# Patient Record
Sex: Male | Born: 1995 | Race: White | Hispanic: No | Marital: Single | Smoking: Never smoker
Health system: Southern US, Community
[De-identification: ages and names within clinical notes are randomized; demographics above are authoritative.]

## PROBLEM LIST (undated history)

## (undated) DIAGNOSIS — R7989 Other specified abnormal findings of blood chemistry: Secondary | ICD-10-CM

## (undated) DIAGNOSIS — R6882 Decreased libido: Secondary | ICD-10-CM

## (undated) DIAGNOSIS — F419 Anxiety disorder, unspecified: Secondary | ICD-10-CM

## (undated) HISTORY — PX: NO PAST SURGERIES: SHX2092

## (undated) HISTORY — DX: Other specified abnormal findings of blood chemistry: R79.89

## (undated) HISTORY — DX: Anxiety disorder, unspecified: F41.9

## (undated) HISTORY — DX: Decreased libido: R68.82

---

## 2019-03-31 ENCOUNTER — Encounter: Payer: Self-pay | Admitting: Sports Medicine

## 2019-03-31 ENCOUNTER — Ambulatory Visit (INDEPENDENT_AMBULATORY_CARE_PROVIDER_SITE_OTHER): Payer: 59 | Admitting: Sports Medicine

## 2019-03-31 DIAGNOSIS — L723 Sebaceous cyst: Secondary | ICD-10-CM | POA: Diagnosis not present

## 2019-03-31 NOTE — Progress Notes (Signed)
Subjective:    CC: Facial mass  HPI:  This is a pleasant 23 year old male, for the past several months has had a slowly growing mass on his left cheek, just in front of the tragus.  Minimally tender, occasionally drains.  Symptoms are moderate, persistent, localized without radiation.  He is eager to have this removed.  I reviewed the past medical history, family history, social history, surgical history, and allergies today and no changes were needed.  Please see the problem list section below in epic for further details.  Past Medical History: Past Medical History:  Diagnosis Date  . No pertinent past medical history    Past Surgical History: Past Surgical History:  Procedure Laterality Date  . NO PAST SURGERIES     Social History: Social History   Socioeconomic History  . Marital status: Single    Spouse name: Not on file  . Number of children: Not on file  . Years of education: Not on file  . Highest education level: Not on file  Occupational History  . Not on file  Social Needs  . Financial resource strain: Not on file  . Food insecurity:    Worry: Not on file    Inability: Not on file  . Transportation needs:    Medical: Not on file    Non-medical: Not on file  Tobacco Use  . Smoking status: Former Games developermoker  . Smokeless tobacco: Never Used  Substance and Sexual Activity  . Alcohol use: Yes  . Drug use: Never  . Sexual activity: Yes  Lifestyle  . Physical activity:    Days per week: Not on file    Minutes per session: Not on file  . Stress: Not on file  Relationships  . Social connections:    Talks on phone: Not on file    Gets together: Not on file    Attends religious service: Not on file    Active member of club or organization: Not on file    Attends meetings of clubs or organizations: Not on file    Relationship status: Not on file  Other Topics Concern  . Not on file  Social History Narrative  . Not on file   Family History: No family history  on file. Allergies: No Known Allergies Medications: See med rec.  Review of Systems: No headache, visual changes, nausea, vomiting, diarrhea, constipation, dizziness, abdominal pain, skin rash, fevers, chills, night sweats, swollen lymph nodes, weight loss, chest pain, body aches, joint swelling, muscle aches, shortness of breath, mood changes, visual or auditory hallucinations.  Objective:    General: Well Developed, well nourished, and in no acute distress.  Neuro: Alert and oriented x3, extra-ocular muscles intact, sensation grossly intact.  HEENT: Normocephalic, atraumatic, pupils equal round reactive to light, neck supple, no masses, no lymphadenopathy, thyroid nonpalpable.  Oropharynx, nasopharynx, ear canals unremarkable, there is a 2 cm subcutaneous mass, well-defined movable anterior to the left tragus, there is a small opening, mild amount of purulent drainage is expressible. Skin: Warm and dry, no rashes noted.  Cardiac: Regular rate and rhythm, no murmurs rubs or gallops.  Respiratory: Clear to auscultation bilaterally. Not using accessory muscles, speaking in full sentences.  Abdominal: Soft, nontender, nondistended, positive bowel sounds, no masses, no organomegaly.  Musculoskeletal: Shoulder, elbow, wrist, hip, knee, ankle stable, and with full range of motion.  Procedure:  Excision of 2 cm left-sided facial mass/subcu tumor. Risks, benefits, and alternatives explained and consent obtained. Time out conducted. Surface prepped with  alcohol. 5cc lidocaine with epinephine infiltrated in a field block. Adequate anesthesia ensured. Area prepped and draped in a sterile fashion. Excision performed with: Using a #10 blade I made a linear incision, this was then carried around in an ellipse to fully encircle the opening to the lesion.  Then using both sharp and blunt dissection I removed the lesion, including its capsule, some sebaceous contents were also expressed.  Once I was  satisfied with the removal, and glistening subcutaneous tissue was visible I then closed the incision with a running subcuticular 4-0 Vicryl suture. Hemostasis achieved. Dermabond applied to seal the incision. Pt stable.  Impression and Recommendations:    The patient was counselled, risk factors were discussed, anticipatory guidance given.  Sebaceous cyst left face Surgical excision of 2 cm subcutaneous mass with primary closure, return in 1 week for a wound check.   ___________________________________________ Ihor Austin. Benjamin Stain, M.D., ABFM., CAQSM. Primary Care and Sports Medicine [facility]  Adjunct Professor of Family Medicine  [employer]

## 2019-03-31 NOTE — Assessment & Plan Note (Signed)
Surgical excision of 2 cm subcutaneous mass with primary closure, return in 1 week for a wound check.

## 2019-03-31 NOTE — Patient Instructions (Signed)
Incision Care, Adult °An incision is a surgical cut that is made through your skin. Most incisions are closed after surgery. Your incision may be closed with stitches (sutures), staples, skin glue, or adhesive strips. You may need to return to your health care provider to have sutures or staples removed. This may occur several days to several weeks after your surgery. The incision needs to be cared for properly to prevent infection. °How to care for your incision °Incision care ° °· Follow instructions from your health care provider about how to take care of your incision. Make sure you: °? Wash your hands with soap and water before you change the bandage (dressing). If soap and water are not available, use hand sanitizer. °? Change your dressing as told by your health care provider. °? Leave sutures, skin glue, or adhesive strips in place. These skin closures may need to stay in place for 2 weeks or longer. If adhesive strip edges start to loosen and curl up, you may trim the loose edges. Do not remove adhesive strips completely unless your health care provider tells you to do that. °· Check your incision area every day for signs of infection. Check for: °? More redness, swelling, or pain. °? More fluid or blood. °? Warmth. °? Pus or a bad smell. °· Ask your health care provider how to clean the incision. This may include: °? Using mild soap and water. °? Using a clean towel to pat the incision dry after cleaning it. °? Applying a cream or ointment. Do this only as told by your health care provider. °? Covering the incision with a clean dressing. °· Ask your health care provider when you can leave the incision uncovered. °· Do not take baths, swim, or use a hot tub until your health care provider approves. Ask your health care provider if you can take showers. You may only be allowed to take sponge baths for bathing. °Medicines °· If you were prescribed an antibiotic medicine, cream, or ointment, take or apply the  antibiotic as told by your health care provider. Do not stop taking or applying the antibiotic even if your condition improves. °· Take over-the-counter and prescription medicines only as told by your health care provider. °General instructions °· Limit movement around your incision to improve healing. °? Avoid straining, lifting, or exercise for the first month, or for as long as told by your health care provider. °? Follow instructions from your health care provider about returning to your normal activities. °? Ask your health care provider what activities are safe. °· Protect your incision from the sun when you are outside for the first 6 months, or for as long as told by your health care provider. Apply sunscreen around the scar or cover it up. °· Keep all follow-up visits as told by your health care provider. This is important. °Contact a health care provider if: °· Your have more redness, swelling, or pain around the incision. °· You have more fluid or blood coming from the incision. °· Your incision feels warm to the touch. °· You have pus or a bad smell coming from the incision. °· You have a fever or shaking chills. °· You are nauseous or you vomit. °· You are dizzy. °· Your sutures or staples come undone. °Get help right away if: °· You have a red streak coming from your incision. °· Your incision bleeds through the dressing and the bleeding does not stop with gentle pressure. °· The edges of   your incision open up and separate. °· You have severe pain. °· You have a rash. °· You are confused. °· You faint. °· You have trouble breathing and a fast heartbeat. °This information is not intended to replace advice given to you by your health care provider. Make sure you discuss any questions you have with your health care provider. °Document Released: 05/09/2005 Document Revised: 06/27/2016 Document Reviewed: 05/07/2016 °Elsevier Interactive Patient Education © 2019 Elsevier Inc. ° °

## 2019-04-12 ENCOUNTER — Ambulatory Visit: Payer: 59 | Admitting: Sports Medicine

## 2019-04-15 ENCOUNTER — Ambulatory Visit (INDEPENDENT_AMBULATORY_CARE_PROVIDER_SITE_OTHER): Payer: 59 | Admitting: Sports Medicine

## 2019-04-15 ENCOUNTER — Ambulatory Visit (INDEPENDENT_AMBULATORY_CARE_PROVIDER_SITE_OTHER): Payer: 59

## 2019-04-15 ENCOUNTER — Encounter: Payer: Self-pay | Admitting: Sports Medicine

## 2019-04-15 ENCOUNTER — Other Ambulatory Visit: Payer: Self-pay

## 2019-04-15 DIAGNOSIS — L723 Sebaceous cyst: Secondary | ICD-10-CM | POA: Diagnosis not present

## 2019-04-15 DIAGNOSIS — M79672 Pain in left foot: Secondary | ICD-10-CM | POA: Diagnosis not present

## 2019-04-15 NOTE — Assessment & Plan Note (Signed)
Moderate pain over the dorsal midfoot at the tarsometatarsal joint for over a month now. Activity modification has not improved his symptoms. I do suspect a tarsometatarsal stress injury. X-rays, referral for custom orthotics, very high cavus foot. 2 Aleve twice a day for 2 weeks. Return to see me in 1 month, MRI and likely boot immobilization if no better.

## 2019-04-15 NOTE — Progress Notes (Signed)
Subjective:    CC: Follow-up  HPI: This is a pleasant 23 year old male, we removed a sebaceous cyst from his left face, this is doing extremely well, there is a bit of a suture protruding, this will be removed.  He has however had pain that he localizes over the dorsal midfoot on the left, mild swelling, moderate, persistent, localized without radiation.  I reviewed the past medical history, family history, social history, surgical history, and allergies today and no changes were needed.  Please see the problem list section below in epic for further details.  Past Medical History: Past Medical History:  Diagnosis Date  . No pertinent past medical history    Past Surgical History: Past Surgical History:  Procedure Laterality Date  . NO PAST SURGERIES     Social History: Social History   Socioeconomic History  . Marital status: Single    Spouse name: Not on file  . Number of children: Not on file  . Years of education: Not on file  . Highest education level: Not on file  Occupational History  . Not on file  Social Needs  . Financial resource strain: Not on file  . Food insecurity    Worry: Not on file    Inability: Not on file  . Transportation needs    Medical: Not on file    Non-medical: Not on file  Tobacco Use  . Smoking status: Former Research scientist (life sciences)  . Smokeless tobacco: Never Used  Substance and Sexual Activity  . Alcohol use: Yes  . Drug use: Never  . Sexual activity: Yes  Lifestyle  . Physical activity    Days per week: Not on file    Minutes per session: Not on file  . Stress: Not on file  Relationships  . Social Herbalist on phone: Not on file    Gets together: Not on file    Attends religious service: Not on file    Active member of club or organization: Not on file    Attends meetings of clubs or organizations: Not on file    Relationship status: Not on file  Other Topics Concern  . Not on file  Social History Narrative  . Not on file    Family History: No family history on file. Allergies: No Known Allergies Medications: See med rec.  Review of Systems: No fevers, chills, night sweats, weight loss, chest pain, or shortness of breath.   Objective:    General: Well Developed, well nourished, and in no acute distress.  Neuro: Alert and oriented x3, extra-ocular muscles intact, sensation grossly intact.  HEENT: Normocephalic, atraumatic, pupils equal round reactive to light, neck supple, no masses, no lymphadenopathy, thyroid nonpalpable.  Skin: Warm and dry, no rashes.  Incision is clean, dry, intact. Cardiac: Regular rate and rhythm, no murmurs rubs or gallops, no lower extremity edema.  Respiratory: Clear to auscultation bilaterally. Not using accessory muscles, speaking in full sentences. Left foot: No visible erythema or swelling. Range of motion is full in all directions. Strength is 5/5 in all directions. No hallux valgus. Severe pes cavus No abnormal callus noted. No pain over the navicular prominence, or base of fifth metatarsal. No tenderness to palpation of the calcaneal insertion of plantar fascia. No pain at the Achilles insertion. No pain over the calcaneal bursa. No pain of the retrocalcaneal bursa. Mild tenderness over the tarsometatarsal joint dorsally No hallux rigidus or limitus. No tenderness palpation over interphalangeal joints. No pain with compression of the  metatarsal heads. Neurovascularly intact distally.  Impression and Recommendations:    Left foot pain Moderate pain over the dorsal midfoot at the tarsometatarsal joint for over a month now. Activity modification has not improved his symptoms. I do suspect a tarsometatarsal stress injury. X-rays, referral for custom orthotics, very high cavus foot. 2 Aleve twice a day for 2 weeks. Return to see me in 1 month, MRI and likely boot immobilization if no better.  Sebaceous cyst left face Doing well, I clipped a bit of protruding  suture, return as needed for this.   ___________________________________________ Jeffrey Kent, M.D., ABFM., CAQSM. Primary Care and Sports Medicine [facility]  Adjunct Professor of Family Medicine  [employer]

## 2019-04-15 NOTE — Assessment & Plan Note (Signed)
Doing well, I clipped a bit of protruding suture, return as needed for this.

## 2019-04-15 NOTE — Patient Instructions (Signed)
I do suspect a tarsometatarsal stress injury. X-rays, referral for custom orthotics, very high cavus foot. 2 Aleve twice a day for 2 weeks. Return to see me in 1 month, MRI and likely boot immobilization if no better.

## 2019-05-13 ENCOUNTER — Ambulatory Visit (INDEPENDENT_AMBULATORY_CARE_PROVIDER_SITE_OTHER): Payer: 59 | Admitting: Sports Medicine

## 2019-05-13 ENCOUNTER — Other Ambulatory Visit: Payer: Self-pay

## 2019-05-13 DIAGNOSIS — M79672 Pain in left foot: Secondary | ICD-10-CM

## 2019-05-13 DIAGNOSIS — F411 Generalized anxiety disorder: Secondary | ICD-10-CM | POA: Insufficient documentation

## 2019-05-13 DIAGNOSIS — N529 Male erectile dysfunction, unspecified: Secondary | ICD-10-CM | POA: Diagnosis not present

## 2019-05-13 DIAGNOSIS — L723 Sebaceous cyst: Secondary | ICD-10-CM

## 2019-05-13 NOTE — Assessment & Plan Note (Signed)
Pervasive through his life, and interfering with his sexual health. I am placing a referral to [name] for behavioral therapy. I would also like him to establish care with 1 of my partners, he is aging out of his pediatrician.

## 2019-05-13 NOTE — Assessment & Plan Note (Addendum)
I think his erectile dysfunction is multifactorial, his low libido is secondary mostly to fear of poor performance. His testosterone, FSH, and LH were all normal. He has difficulty achieving and maintaining a quality erection, both with his partner and when masturbating. I would like him to treat his anxiety first, I will set him up with behavioral therapy but I would also like him to establish with 1 of my partners for pharmacotherapy if needed. If after 2 months of behavioral therapy we are not noticing any improvement in erectile function we will consider a compressive ring +/- a 5 phosphodiesterase inhibitor.

## 2019-05-13 NOTE — Progress Notes (Signed)
Subjective:    CC: Follow-up  HPI: Jeffrey RushingSeth is a very pleasant 23 year old male, we were treating him for foot pain which has since resolved, he does have fairly marketed pes cavus, was not able to get orthotics.  Moot point as he does not have any more pain.  His surgical scar from removing a facial sebaceous cyst is healing well.  At the end of the visit he did ask to discuss a personal issue, he has noted increasing difficulty achieving, and maintaining quality erections.  He was seen by his pediatrician, had some routine labs ordered including CBC, CMP, testosterone, FSH, LH, everything was normal, testosterone was 290 but still within the normal range of the lab.  On further questioning he is having significant anxiety, and anxiety interferes with his erectile function, and his lack of adequate erectile function leads to low libido.  He denies use of any drugs, cigarettes, alcohol.  When he masturbates he still has similar issues albeit less pronounced.  I reviewed the past medical history, family history, social history, surgical history, and allergies today and no changes were needed.  Please see the problem list section below in epic for further details.  Past Medical History: Past Medical History:  Diagnosis Date  . No pertinent past medical history    Past Surgical History: Past Surgical History:  Procedure Laterality Date  . NO PAST SURGERIES     Social History: Social History   Socioeconomic History  . Marital status: Single    Spouse name: Not on file  . Number of children: Not on file  . Years of education: Not on file  . Highest education level: Not on file  Occupational History  . Not on file  Social Needs  . Financial resource strain: Not on file  . Food insecurity    Worry: Not on file    Inability: Not on file  . Transportation needs    Medical: Not on file    Non-medical: Not on file  Tobacco Use  . Smoking status: Former Games developermoker  . Smokeless tobacco:  Never Used  Substance and Sexual Activity  . Alcohol use: Yes  . Drug use: Never  . Sexual activity: Yes  Lifestyle  . Physical activity    Days per week: Not on file    Minutes per session: Not on file  . Stress: Not on file  Relationships  . Social Musicianconnections    Talks on phone: Not on file    Gets together: Not on file    Attends religious service: Not on file    Active member of club or organization: Not on file    Attends meetings of clubs or organizations: Not on file    Relationship status: Not on file  Other Topics Concern  . Not on file  Social History Narrative  . Not on file   Family History: No family history on file. Allergies: No Known Allergies Medications: See med rec.  Review of Systems: No fevers, chills, night sweats, weight loss, chest pain, or shortness of breath.   Objective:    General: Well Developed, well nourished, and in no acute distress.  Neuro: Alert and oriented x3, extra-ocular muscles intact, sensation grossly intact.  HEENT: Normocephalic, atraumatic, pupils equal round reactive to light, neck supple, no masses, no lymphadenopathy, thyroid nonpalpable.  Skin: Warm and dry, no rashes. Cardiac: Regular rate and rhythm, no murmurs rubs or gallops, no lower extremity edema.  Respiratory: Clear to auscultation bilaterally. Not using accessory  muscles, speaking in full sentences.  Impression and Recommendations:    Left foot pain Dorsal foot pain has resolved with Aleve. He was never able to get the custom orthotics. He does have fairly severe pes cavus, so we could certainly revisit custom orthotics or simply add to scaphoid pads to his foot where should this return.  Sebaceous cyst left face Jeffrey Kent is healing extremely well, almost imperceptible now.  Erectile dysfunction I think his erectile dysfunction is multifactorial, his low libido is secondary mostly to fear of poor performance. His testosterone, FSH, and LH were all normal. He  has difficulty achieving and maintaining a quality erection, both with his partner and when masturbating. I would like him to treat his anxiety first, I will set him up with behavioral therapy but I would also like him to establish with 1 of my partners for pharmacotherapy if needed. If after 2 months of behavioral therapy we are not noticing any improvement in erectile function we will consider a compressive ring +/- a 5 phosphodiesterase inhibitor.  Generalized anxiety disorder Pervasive through his life, and interfering with his sexual health. I am placing a referral to Jeffrey Kent for behavioral therapy. I would also like him to establish care with 1 of my partners, he is aging out of his pediatrician.   ___________________________________________ Gwen Her. Dianah Field, M.D., ABFM., CAQSM. Primary Care and Sports Medicine [facility]  Adjunct Professor of [employer]

## 2019-05-13 NOTE — Assessment & Plan Note (Signed)
Jeffrey Kent is healing extremely well, almost imperceptible now.

## 2019-05-13 NOTE — Assessment & Plan Note (Signed)
Dorsal foot pain has resolved with Aleve. He was never able to get the custom orthotics. He does have fairly severe pes cavus, so we could certainly revisit custom orthotics or simply add to scaphoid pads to his foot where should this return.

## 2019-05-20 ENCOUNTER — Ambulatory Visit (INDEPENDENT_AMBULATORY_CARE_PROVIDER_SITE_OTHER): Payer: 59 | Admitting: Physician Assistant

## 2019-05-20 ENCOUNTER — Encounter: Payer: Self-pay | Admitting: Physician Assistant

## 2019-05-20 VITALS — BP 127/68 | HR 42 | Temp 98.2°F | Ht 72.0 in | Wt 191.0 lb

## 2019-05-20 DIAGNOSIS — R6882 Decreased libido: Secondary | ICD-10-CM | POA: Diagnosis not present

## 2019-05-20 DIAGNOSIS — Z7689 Persons encountering health services in other specified circumstances: Secondary | ICD-10-CM | POA: Diagnosis not present

## 2019-05-20 DIAGNOSIS — Z23 Encounter for immunization: Secondary | ICD-10-CM | POA: Diagnosis not present

## 2019-05-20 DIAGNOSIS — R7989 Other specified abnormal findings of blood chemistry: Secondary | ICD-10-CM

## 2019-05-20 NOTE — Progress Notes (Signed)
HPI:                                                                Vern Guerette is a 23 y.o. male who presents to [place] today to establish care  Current concerns: testosterone levels  Reports he has been having intermittent erectile dysfunction/decreased strength of erections, weight gain, tiredness/lack of energy for the last 6 months or so. He had his hormone levels checked at a private online lab. Reports first reading showed low normal testosterone in the 200's. His second readings dated Feb 2020 improved and are noted below: Total testosterone 385.8 Free testosterone 11.1 LH 5.8 FSH 3.9 Estradiol 9.0 Denies family hx of hypogonadism. He smokes marijuana approx 2 times per week. No other illicit drug use. He drinks approx 5 drinks per week.   Depression screen [facility] 2/9 05/20/2019  Decreased Interest 0  Down, Depressed, Hopeless 0  PHQ - 2 Score 0  Altered sleeping 1  Tired, decreased energy 0  Change in appetite 0  Feeling bad or failure about yourself  0  Trouble concentrating 0  Moving slowly or fidgety/restless 0  Suicidal thoughts 0  PHQ-9 Score 1    GAD 7 : Generalized Anxiety Score 05/20/2019  Nervous, Anxious, on Edge 1  Control/stop worrying 0  Worry too much - different things 0  Trouble relaxing 0  Restless 0  Easily annoyed or irritable 0  Afraid - awful might happen 0  Total GAD 7 Score 1      Past Medical History:  Diagnosis Date  . No pertinent past medical history    Past Surgical History:  Procedure Laterality Date  . NO PAST SURGERIES     Social History   Tobacco Use  . Smoking status: Former Research scientist (life sciences)  . Smokeless tobacco: Never Used  Substance Use Topics  . Alcohol use: Yes   family history is not on file.    ROS: negative except as noted in the HPI  Medications: No current outpatient medications on file.   No current facility-administered medications for this visit.    No  Known Allergies     Objective:  BP 127/68   Pulse (!) 42   Temp 98.2 F (36.8 C) (Oral)   Ht 6' (1.829 m)   Wt 191 lb (86.6 kg)   BMI 25.90 kg/m  Gen:  alert, not ill-appearing, no distress, appropriate for age 79: head normocephalic without obvious abnormality, conjunctiva and cornea clear, trachea midline Pulm: Normal work of breathing, normal phonation  Neuro: alert and oriented x 3, no tremor MSK: extremities atraumatic, normal gait and station Skin: intact, no rashes on exposed skin, no jaundice, no cyanosis Psych: well-groomed, cooperative, good eye contact, euthymic mood, affect mood-congruent, speech is articulate, and thought processes clear and goal-directed    No results found for this or any previous visit (from the past 72 hour(s)). No results found.    Assessment and Plan: 23 y.o. male with   .Zeph was seen today for annual exam.  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 CMP unremarkable His labs do not suggest primary or secondary hypogonadism Recommended he abstain from marijuana for 4-6 weeks and recheck fasting 8 am total and free testosterone   Patient education and anticipatory guidance given Patient agrees with treatment plan Follow-up as needed if symptoms worsen or fail to improve  Levonne Hubertharley E. Cummings PA-C

## 2019-06-08 ENCOUNTER — Ambulatory Visit (INDEPENDENT_AMBULATORY_CARE_PROVIDER_SITE_OTHER): Payer: 59 | Admitting: Professional

## 2019-06-08 DIAGNOSIS — F411 Generalized anxiety disorder: Secondary | ICD-10-CM

## 2019-06-22 ENCOUNTER — Ambulatory Visit (INDEPENDENT_AMBULATORY_CARE_PROVIDER_SITE_OTHER): Payer: 59 | Admitting: Professional

## 2019-06-22 DIAGNOSIS — F411 Generalized anxiety disorder: Secondary | ICD-10-CM

## 2019-06-27 ENCOUNTER — Ambulatory Visit: Payer: Self-pay | Admitting: Professional

## 2019-06-30 ENCOUNTER — Ambulatory Visit (INDEPENDENT_AMBULATORY_CARE_PROVIDER_SITE_OTHER): Payer: 59 | Admitting: Professional

## 2019-06-30 DIAGNOSIS — F411 Generalized anxiety disorder: Secondary | ICD-10-CM | POA: Diagnosis not present

## 2019-07-04 LAB — TESTOSTERONE TOTAL,FREE,BIO, MALES
Albumin: 4.5 g/dL (ref 3.6–5.1)
Sex Hormone Binding: 16 nmol/L (ref 10–50)
Testosterone: 248 ng/dL — ABNORMAL LOW (ref 250–827)

## 2019-07-04 NOTE — Addendum Note (Signed)
Addended by: Nelson Chimes E on: 07/04/2019 05:25 PM   Modules accepted: Orders

## 2019-07-05 ENCOUNTER — Telehealth: Payer: Self-pay

## 2019-07-05 DIAGNOSIS — R7989 Other specified abnormal findings of blood chemistry: Secondary | ICD-10-CM

## 2019-07-05 NOTE — Telephone Encounter (Signed)
Patent called with some concerns about his lab results.   Patient states that his step-brother had some similar issues and ended up having a pituitary tumor.  Patient wants to know if it would be okay to add on prolactin levels?

## 2019-07-05 NOTE — Telephone Encounter (Signed)
Pt. Advised. 

## 2019-07-07 ENCOUNTER — Ambulatory Visit (INDEPENDENT_AMBULATORY_CARE_PROVIDER_SITE_OTHER): Payer: 59 | Admitting: Professional

## 2019-07-07 DIAGNOSIS — F411 Generalized anxiety disorder: Secondary | ICD-10-CM | POA: Diagnosis not present

## 2019-07-15 ENCOUNTER — Ambulatory Visit: Payer: 59 | Admitting: Sports Medicine

## 2019-07-21 ENCOUNTER — Ambulatory Visit (INDEPENDENT_AMBULATORY_CARE_PROVIDER_SITE_OTHER): Payer: 59 | Admitting: Professional

## 2019-07-21 DIAGNOSIS — F411 Generalized anxiety disorder: Secondary | ICD-10-CM | POA: Diagnosis not present

## 2019-07-28 ENCOUNTER — Ambulatory Visit: Payer: 59 | Admitting: Professional

## 2019-08-05 ENCOUNTER — Ambulatory Visit (INDEPENDENT_AMBULATORY_CARE_PROVIDER_SITE_OTHER): Payer: 59 | Admitting: Professional

## 2019-08-05 DIAGNOSIS — F411 Generalized anxiety disorder: Secondary | ICD-10-CM | POA: Diagnosis not present

## 2019-08-10 ENCOUNTER — Ambulatory Visit (INDEPENDENT_AMBULATORY_CARE_PROVIDER_SITE_OTHER): Payer: 59 | Admitting: Professional

## 2019-08-10 DIAGNOSIS — F411 Generalized anxiety disorder: Secondary | ICD-10-CM

## 2019-08-19 ENCOUNTER — Other Ambulatory Visit: Payer: Self-pay

## 2019-08-19 ENCOUNTER — Ambulatory Visit (INDEPENDENT_AMBULATORY_CARE_PROVIDER_SITE_OTHER): Payer: 59 | Admitting: Sports Medicine

## 2019-08-19 ENCOUNTER — Encounter: Payer: Self-pay | Admitting: Sports Medicine

## 2019-08-19 ENCOUNTER — Ambulatory Visit (INDEPENDENT_AMBULATORY_CARE_PROVIDER_SITE_OTHER): Payer: 59

## 2019-08-19 DIAGNOSIS — M25552 Pain in left hip: Secondary | ICD-10-CM | POA: Diagnosis not present

## 2019-08-19 MED ORDER — MELOXICAM 15 MG PO TABS: ORAL_TABLET | ORAL | 3 refills | Status: DC

## 2019-08-19 NOTE — Assessment & Plan Note (Addendum)
Suspect hip adductor strain. He also has a positive flexion/adduction/internal rotation test suspicious for labral injury versus femoral acetabular impingement. X-rays, hip rehab exercises, avoid squats for the next 2 weeks, meloxicam. Follow-up in 1 month, if persistent discomfort we will proceed with MR arthrogram looking for a labral injury.

## 2019-08-19 NOTE — Progress Notes (Signed)
Subjective:    CC: Left hip pain  HPI: For just over a week now this pleasant 23 year old male has noted pain in his left hip and groin, worse with doing deep squats.  Moderate, persistent, localized without radiation, nothing on the scrotum, penis, testicle.  No mechanical symptoms.  I reviewed the past medical history, family history, social history, surgical history, and allergies today and no changes were needed.  Please see the problem list section below in epic for further details.  Past Medical History: Past Medical History:  Diagnosis Date  . Anxiety   . Low libido    Past Surgical History: Past Surgical History:  Procedure Laterality Date  . NO PAST SURGERIES     Social History: Social History   Socioeconomic History  . Marital status: Single    Spouse name: Not on file  . Number of children: Not on file  . Years of education: Not on file  . Highest education level: Not on file  Occupational History  . Not on file  Social Needs  . Financial resource strain: Not on file  . Food insecurity    Worry: Not on file    Inability: Not on file  . Transportation needs    Medical: Not on file    Non-medical: Not on file  Tobacco Use  . Smoking status: Never Smoker  . Smokeless tobacco: Never Used  Substance and Sexual Activity  . Alcohol use: Yes    Alcohol/week: 5.0 standard drinks    Types: 5 Standard drinks or equivalent per week  . Drug use: Yes    Types: Marijuana    Comment: 2 times per week  . Sexual activity: Yes    Birth control/protection: I.U.D.  Lifestyle  . Physical activity    Days per week: Not on file    Minutes per session: Not on file  . Stress: Not on file  Relationships  . Social Herbalist on phone: Not on file    Gets together: Not on file    Attends religious service: Not on file    Active member of club or organization: Not on file    Attends meetings of clubs or organizations: Not on file    Relationship status: Not on  file  Other Topics Concern  . Not on file  Social History Narrative  . Not on file   Family History: Family History  Problem Relation Age of Onset  . Fibromyalgia Mother    Allergies: No Known Allergies Medications: See med rec.  Review of Systems: No fevers, chills, night sweats, weight loss, chest pain, or shortness of breath.   Objective:    General: Well Developed, well nourished, and in no acute distress.  Neuro: Alert and oriented x3, extra-ocular muscles intact, sensation grossly intact.  HEENT: Normocephalic, atraumatic, pupils equal round reactive to light, neck supple, no masses, no lymphadenopathy, thyroid nonpalpable.  Skin: Warm and dry, no rashes. Cardiac: Regular rate and rhythm, no murmurs rubs or gallops, no lower extremity edema.  Respiratory: Clear to auscultation bilaterally. Not using accessory muscles, speaking in full sentences. Left hip: ROM IR: 69 Deg with reproduction of pain, ER: 60 Deg, Flexion: 120 Deg, Extension: 100 Deg, Abduction: 45 Deg, Adduction: 45 Deg Strength IR: 5/5, ER: 5/5, Flexion: 5/5, Extension: 5/5, Abduction: 5/5, Adduction: 5/5 Pelvic alignment unremarkable to inspection and palpation. Standing hip rotation and gait without trendelenburg / unsteadiness. Greater trochanter without tenderness to palpation. No tenderness over piriformis. No SI  joint tenderness and normal minimal SI movement. I am able to reproduce pain with resisted hip flexion and internal straight leg raise. He also has a positive flexion/adduction/internal rotation test suspicious for labral injury versus femoral acetabular impingement.  Impression and Recommendations:    Left hip pain Suspect hip adductor strain. He also has a positive flexion/adduction/internal rotation test suspicious for labral injury versus femoral acetabular impingement. X-rays, hip rehab exercises, avoid squats for the next 2 weeks, meloxicam. Follow-up in 1 month, if persistent  discomfort we will proceed with MR arthrogram looking for a labral injury.   ___________________________________________ Ihor Austin. Benjamin Stain, M.D., ABFM., CAQSM. Primary Care and Sports Medicine [facility]  Adjunct Professor of Family Medicine  [employer]

## 2019-08-24 ENCOUNTER — Ambulatory Visit: Payer: 59 | Admitting: Professional

## 2019-08-26 ENCOUNTER — Ambulatory Visit (INDEPENDENT_AMBULATORY_CARE_PROVIDER_SITE_OTHER): Payer: 59 | Admitting: Sports Medicine

## 2019-08-26 ENCOUNTER — Ambulatory Visit: Payer: 59 | Admitting: Professional

## 2019-08-26 ENCOUNTER — Ambulatory Visit (INDEPENDENT_AMBULATORY_CARE_PROVIDER_SITE_OTHER): Payer: 59

## 2019-08-26 ENCOUNTER — Other Ambulatory Visit: Payer: Self-pay

## 2019-08-26 ENCOUNTER — Encounter: Payer: Self-pay | Admitting: Sports Medicine

## 2019-08-26 DIAGNOSIS — R1032 Left lower quadrant pain: Secondary | ICD-10-CM | POA: Diagnosis not present

## 2019-08-26 DIAGNOSIS — M25552 Pain in left hip: Secondary | ICD-10-CM

## 2019-08-26 MED ORDER — IOHEXOL 300 MG/ML  SOLN
100.0000 mL | Freq: Once | INTRAMUSCULAR | Status: AC | PRN
  Administered 2019-08-26: 14:00:00 100 mL via INTRAVENOUS

## 2019-08-26 NOTE — Progress Notes (Signed)
Subjective:    CC: Abdominal pain, hip pain  HPI: Jeffrey Kent is a very pleasant 23 year old male, for approximately 2 weeks now he has had pain in his left lower quadrant, radiating into the hip and the left flank.  Initially this seemed like a hip labral tear, he did not respond to conservative treatment he does have mechanical symptoms.  He also feels as though his pain worsens with Valsalva.  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  No vomiting, constipation, melena, hematochezia, hematemesis.  No diarrhea.  I reviewed the past medical history, family history, social history, surgical history, and allergies today and no changes were needed.  Please see the problem list section below in epic for further details.  Past Medical History: Past Medical History:  Diagnosis Date  . Anxiety   . Low libido    Past Surgical History: Past Surgical History:  Procedure Laterality Date  . NO PAST SURGERIES     Social History: Social History   Socioeconomic History  . Marital status: Single    Spouse name: Not on file  . Number of children: Not on file  . Years of education: Not on file  . Highest education level: Not on file  Occupational History  . Not on file  Social Needs  . Financial resource strain: Not on file  . Food insecurity    Worry: Not on file    Inability: Not on file  . Transportation needs    Medical: Not on file    Non-medical: Not on file  Tobacco Use  . Smoking status: Never Smoker  . Smokeless tobacco: Never Used  Substance and Sexual Activity  . Alcohol use: Yes    Alcohol/week: 5.0 standard drinks    Types: 5 Standard drinks or equivalent per week  . Drug use: Yes    Types: Marijuana    Comment: 2 times per week  . Sexual activity: Yes    Birth control/protection: I.U.D.  Lifestyle  . Physical activity    Days per week: Not on file    Minutes per session: Not on file   . Stress: Not on file  Relationships  . Social Musician on phone: Not on file    Gets together: Not on file    Attends religious service: Not on file    Active member of club or organization: Not on file    Attends meetings of clubs or organizations: Not on file    Relationship status: Not on file  Other Topics Concern  . Not on file  Social History Narrative  . Not on file   Family History: Family History  Problem Relation Age of Onset  . Fibromyalgia Mother    Allergies: No Known Allergies Medications: See med rec.  Review of Systems: No fevers, chills, night sweats, weight loss, chest pain, or shortness of breath.   Objective:    General: Well Developed, well nourished, and in no acute distress.  Neuro: Alert and oriented x3, extra-ocular muscles intact, sensation grossly intact.  HEENT: Normocephalic, atraumatic, pupils equal round reactive to light, neck supple, no masses, no lymphadenopathy, thyroid nonpalpable.  Skin: Warm and dry, no rashes. Cardiac: Regular rate and rhythm, no murmurs rubs or gallops, no lower extremity edema.  Respiratory: Clear to auscultation bilaterally. Not using accessory muscles, speaking in full sentences.  Impression and Recommendations:    Left hip pain Persistent left groin pain  worse with flexion/adduction/internal rotation worrisome for a labral injury versus femoral acetabular impingement. He also has significant mechanical symptoms. X-rays were unrevealing, pain is intractable, meloxicam not helping, adding a MR arthrogram, we can hopefully do this next week.  Acute abdominal pain in left lower quadrant Still unclear etiology at this point, adding a stat CT of the abdomen and pelvis with oral and IV contrast, referral to general surgery, I did a hernia exam and I did not feel an inguinal hernia though I would like the general surgeons to weigh in as well. His left hip pain does refer into his left lower quadrant.  No  melena, hematochezia, hematemesis, constipation, diarrhea.   ___________________________________________ Gwen Her. Dianah Field, M.D., ABFM., CAQSM. Primary Care and Sports Medicine [facility]  Adjunct Professor of [employer]

## 2019-08-26 NOTE — Assessment & Plan Note (Signed)
Still unclear etiology at this point, adding a stat CT of the abdomen and pelvis with oral and IV contrast, referral to general surgery, I did a hernia exam and I did not feel an inguinal hernia though I would like the general surgeons to weigh in as well. His left hip pain does refer into his left lower quadrant.  No melena, hematochezia, hematemesis, constipation, diarrhea.

## 2019-08-26 NOTE — Assessment & Plan Note (Signed)
Persistent left groin pain worse with flexion/adduction/internal rotation worrisome for a labral injury versus femoral acetabular impingement. He also has significant mechanical symptoms. X-rays were unrevealing, pain is intractable, meloxicam not helping, adding a MR arthrogram, we can hopefully do this next week.

## 2019-08-27 ENCOUNTER — Encounter: Payer: Self-pay | Admitting: Sports Medicine

## 2019-08-27 DIAGNOSIS — R1032 Left lower quadrant pain: Secondary | ICD-10-CM

## 2019-08-29 MED ORDER — HYOSCYAMINE SULFATE 0.125 MG PO TABS: 0.1250 mg | ORAL_TABLET | ORAL | 3 refills | Status: DC | PRN

## 2019-09-05 ENCOUNTER — Other Ambulatory Visit: Payer: 59

## 2019-09-05 ENCOUNTER — Ambulatory Visit: Payer: 59 | Admitting: Sports Medicine

## 2019-09-07 ENCOUNTER — Ambulatory Visit: Payer: 59 | Admitting: Professional

## 2019-09-09 ENCOUNTER — Ambulatory Visit (INDEPENDENT_AMBULATORY_CARE_PROVIDER_SITE_OTHER): Payer: 59 | Admitting: Professional

## 2019-09-09 DIAGNOSIS — F411 Generalized anxiety disorder: Secondary | ICD-10-CM | POA: Diagnosis not present

## 2019-09-16 ENCOUNTER — Ambulatory Visit: Payer: 59 | Admitting: Sports Medicine

## 2019-09-21 ENCOUNTER — Ambulatory Visit: Payer: 59 | Admitting: Professional

## 2019-10-05 ENCOUNTER — Ambulatory Visit: Payer: 59 | Admitting: Professional

## 2019-10-19 ENCOUNTER — Ambulatory Visit: Payer: 59 | Admitting: Professional

## 2019-11-08 ENCOUNTER — Other Ambulatory Visit: Payer: Self-pay

## 2019-11-08 ENCOUNTER — Encounter: Payer: Self-pay | Admitting: Physician Assistant

## 2019-11-08 ENCOUNTER — Ambulatory Visit (INDEPENDENT_AMBULATORY_CARE_PROVIDER_SITE_OTHER): Payer: 59 | Admitting: Physician Assistant

## 2019-11-08 VITALS — BP 125/73 | HR 67 | Temp 97.9°F | Ht 71.0 in | Wt 197.0 lb

## 2019-11-08 DIAGNOSIS — I498 Other specified cardiac arrhythmias: Secondary | ICD-10-CM

## 2019-11-08 DIAGNOSIS — R002 Palpitations: Secondary | ICD-10-CM | POA: Diagnosis not present

## 2019-11-08 DIAGNOSIS — Z720 Tobacco use: Secondary | ICD-10-CM

## 2019-11-08 NOTE — Progress Notes (Signed)
sHPI:                                                                Jeffrey Kent is a 24 y.o. male who presents to [facility] today for palpitations  New onset heart palpitations, last night He was laying in bed and he became consciously aware of his heart beat and felt a palpitation in his throat.  Associated with altered sensation of his sternal area like numbness. This lasted for about 5 hours. He tried meditating, but it was hard to focus. Resolved spontaneously. Denies exertional chest pain, change in exercise tolerance, DOE, PND, habitual snoring, orthopnea, pre-syncope/syncope. He is physically active and goes to the gym most days.  He self-monitors his BP intermittently and typically 120's/70's and resting HR is usually in the 50's Vapes 1 cartridge approx every other day for the last 2 years He drinks 1 coffee in the morning and then an energy drink mid-day before going to the gym Denies recreational drug use Drinks socially approx twice per week Supplements: Multivit, fish oil, creatine, vitamin d He feels anxiety is well controlled with exercise and meditation.   Past Medical History:  Diagnosis Date  . Anxiety   . Low libido   . Low testosterone    Past Surgical History:  Procedure Laterality Date  . NO PAST SURGERIES     Social History   Tobacco Use  . Smoking status: Never Smoker  . Smokeless tobacco: Never Used  Substance Use Topics  . Alcohol use: Yes    Alcohol/week: 5.0 standard drinks    Types: 5 Standard drinks or equivalent per week   family history includes Fibromyalgia in his mother; Heart murmur in his mother; High blood pressure in his brother.    ROS: negative except as noted in the HPI  Medications: Current Outpatient Medications  Medication Sig Dispense Refill  . meloxicam (MOBIC) 15 MG tablet One tab PO qAM with breakfast for 2 weeks, then daily prn pain. 30 tablet 3   No current  facility-administered medications for this visit.   No Known Allergies     Objective:  BP 125/73   Pulse 67   Temp 97.9 F (36.6 C) (Oral)   Ht 5\' 11"  (1.803 m)   Wt 197 lb (89.4 kg)   SpO2 100%   BMI 27.48 kg/m   Vitals:   11/08/19 1143 11/08/19 1203  BP: (!) 147/67 125/73  Pulse: 84 67  Temp: 97.9 F (36.6 C)   SpO2: 100%     Wt Readings from Last 3 Encounters:  11/08/19 197 lb (89.4 kg)  08/26/19 187 lb (84.8 kg)  08/19/19 184 lb (83.5 kg)   Temp Readings from Last 3 Encounters:  11/08/19 97.9 F (36.6 C) (Oral)  05/20/19 98.2 F (36.8 C) (Oral)   BP Readings from Last 3 Encounters:  11/08/19 125/73  08/26/19 133/76  08/19/19 (!) 142/82   Pulse Readings from Last 3 Encounters:  11/08/19 67  08/26/19 66  08/19/19 67    Gen:  alert, not ill-appearing, no distress, appropriate for age 89: head normocephalic without obvious abnormality, conjunctiva and cornea clear, trachea midline Pulm: Normal work of breathing, normal phonation, clear to auscultation bilaterally, no wheezes, rales or  rhonchi CV: Normal rate, regular rhythm, s1 and s2 distinct, no murmurs, clicks or rubs  Neuro: alert and oriented x 3, no tremor MSK: extremities atraumatic, normal gait and , no peripheral edema Skin: intact, no rashes on exposed skin, no jaundice, no cyanosis Psych: well-groomed, cooperative, good eye contact, euthymic mood, affect mood-congruent, speech is articulate, and thought processes clear and goal-directed  ECG 11/08/19 11:39 Vent rate 74 bpm PR-I 150 ms QRS 100 ms QT/QTc 360/399 ms Normal sinus rhythm with sinus arrhythmia   No results found for this or any previous visit (from the past 72 hour(s)). No results found.    Assessment and Plan: 24 y.o. male with   .Jeffrey Kent was seen today for palpitations.  Diagnoses and all orders for this visit:  Palpitations -     EKG 12-Lead  Sinus arrhythmia seen on electrocardiogram  Current every day nicotine  vaping   ECG personally reviewed by myself and Dr. Linford Arnold Sinus arrhythmia present with no other abnormalities BP normotensive on re-check Nicotine and caffeine are likely contributory. Discussed lifestyle modification Counseled on ED/return precautions    Patient education and anticipatory guidance given Patient agrees with treatment plan Follow-up as needed if symptoms worsen or fail to improve  Levonne Hubert PA-C

## 2019-11-24 ENCOUNTER — Encounter: Payer: Self-pay | Admitting: Physician Assistant

## 2019-11-30 ENCOUNTER — Other Ambulatory Visit: Payer: Self-pay

## 2019-11-30 ENCOUNTER — Encounter: Payer: Self-pay | Admitting: Medical-Surgical

## 2019-11-30 ENCOUNTER — Ambulatory Visit (INDEPENDENT_AMBULATORY_CARE_PROVIDER_SITE_OTHER): Payer: 59 | Admitting: Medical-Surgical

## 2019-11-30 VITALS — BP 124/70 | HR 84 | Temp 98.2°F | Ht 71.0 in | Wt 200.1 lb

## 2019-11-30 DIAGNOSIS — R030 Elevated blood-pressure reading, without diagnosis of hypertension: Secondary | ICD-10-CM

## 2019-11-30 NOTE — Patient Instructions (Signed)
Elevated BP   Obtain automatic BP monitor if possible  Check BP daily at different times of the day and keep record for the next 2 weeks  Send record via MyChart for evaluation  Continue to exercise regularly  Maintain low sodium diet  Checking CBC, CMP, TSH, Lipids    Preventing Hypertension Hypertension, commonly called high blood pressure, is when the force of blood pumping through the arteries is too strong. Arteries are blood vessels that carry blood from the heart throughout the body. Over time, hypertension can damage the arteries and decrease blood flow to important parts of the body, including the brain, heart, and kidneys. Often, hypertension does not cause symptoms until blood pressure is very high. For this reason, it is important to have your blood pressure checked on a regular basis. Hypertension can often be prevented with diet and lifestyle changes. If you already have hypertension, you can control it with diet and lifestyle changes, as well as medicine. What nutrition changes can be made? Maintain a healthy diet. This includes:  Eating less salt (sodium). Ask your health care provider how much sodium is safe for you to have. The general recommendation is to consume less than 1 tsp (2,300 mg) of sodium a day. ? Do not add salt to your food. ? Choose low-sodium options when grocery shopping and eating out.  Limiting fats in your diet. You can do this by eating low-fat or fat-free dairy products and by eating less red meat.  Eating more fruits, vegetables, and whole grains. Make a goal to eat: ? 1-2 cups of fresh fruits and vegetables each day. ? 3-4 servings of whole grains each day.  Avoiding foods and beverages that have added sugars.  Eating fish that contain healthy fats (omega-3 fatty acids), such as mackerel or salmon. If you need help putting together a healthy eating plan, try the DASH diet. This diet is high in fruits, vegetables, and whole grains. It is  low in sodium, red meat, and added sugars. DASH stands for Dietary Approaches to Stop Hypertension. What lifestyle changes can be made?   Lose weight if you are overweight. Losing just 3?5% of your body weight can help prevent or control hypertension. ? For example, if your present weight is 200 lb (91 kg), a loss of 3-5% of your weight means losing 6-10 lb (2.7-4.5 kg). ? Ask your health care provider to help you with a diet and exercise plan to safely lose weight.  Get enough exercise. Do at least 150 minutes of moderate-intensity exercise each week. ? You could do this in short exercise sessions several times a day, or you could do longer exercise sessions a few times a week. For example, you could take a brisk 10-minute walk or bike ride, 3 times a day, for 5 days a week.  Find ways to reduce stress, such as exercising, meditating, listening to music, or taking a yoga class. If you need help reducing stress, ask your health care provider.  Do not smoke. This includes e-cigarettes. Chemicals in tobacco and nicotine products raise your blood pressure each time you smoke. If you need help quitting, ask your health care provider.  Avoid alcohol. If you drink alcohol, limit alcohol intake to no more than 1 drink a day for nonpregnant women and 2 drinks a day for men. One drink equals 12 oz of beer, 5 oz of wine, or 1 oz of hard liquor. Why are these changes important? Diet and lifestyle changes can help  you prevent hypertension, and they may make you feel better overall and improve your quality of life. If you have hypertension, making these changes will help you control it and help prevent major complications, such as:  Hardening and narrowing of arteries that supply blood to: ? Your heart. This can cause a heart attack. ? Your brain. This can cause a stroke. ? Your kidneys. This can cause kidney failure.  Stress on your heart muscle, which can cause heart failure. What can I do to lower my  risk?  Work with your health care provider to make a hypertension prevention plan that works for you. Follow your plan and keep all follow-up visits as told by your health care provider.  Learn how to check your blood pressure at home. Make sure that you know your personal target blood pressure, as told by your health care provider. How is this treated? In addition to diet and lifestyle changes, your health care provider may recommend medicines to help lower your blood pressure. You may need to try a few different medicines to find what works best for you. You also may need to take more than one medicine. Take over-the-counter and prescription medicines only as told by your health care provider. Where to find support Your health care provider can help you prevent hypertension and help you keep your blood pressure at a healthy level. Your local hospital or your community may also provide support services and prevention programs. The American Heart Association offers an online support network at: https://www.lee.net/ Where to find more information Learn more about hypertension from:  National Heart, Lung, and Blood Institute: https://www.peterson.org/  Centers for Disease Control and Prevention: AboutHD.co.nz  American Academy of Family Physicians: http://familydoctor.org/familydoctor/en/diseases-conditions/high-blood-pressure.printerview.all.html Learn more about the DASH diet from:  National Heart, Lung, and Blood Institute: WedMap.it Contact a health care provider if:  You think you are having a reaction to medicines you have taken.  You have recurrent headaches or feel dizzy.  You have swelling in your ankles.  You have trouble with your vision. Summary  Hypertension often does not cause any symptoms until blood pressure is very high. It is important to get your blood pressure  checked regularly.  Diet and lifestyle changes are the most important steps in preventing hypertension.  By keeping your blood pressure in a healthy range, you can prevent complications like heart attack, heart failure, stroke, and kidney failure.  Work with your health care provider to make a hypertension prevention plan that works for you. This information is not intended to replace advice given to you by your health care provider. Make sure you discuss any questions you have with your health care provider. Document Revised: 02/11/2019 Document Reviewed: 06/30/2016 Elsevier Patient Education  2020 [employer].

## 2019-11-30 NOTE — Progress Notes (Signed)
Subjective:    CC: elevated BP  HPI: Pleasant 24 year old male presenting today with reports of elevated BP at home. Has had several BP readings checked manually by his mom with readings in 150/80s and one episode of 180/100 when donating blood. Diagnosed with elevated BP when he was a child and has strong family hx of HTN. Started ASA 81mg  a few weeks ago because he read that it was used in people with heart/BP problems. Intermittent sternal/right chest pain, feels more muscular, none currently. Palpitations at night, previously worked up as sinus arrhythmia and thought to be related to caffeine/energy drink intake.  Still drinking coffee and energy drinks daily. Exercises 5 times weekly. Limits sodium intake to 2500mg  daily. Admits to having significant anxiety regarding health issues.  I reviewed the past medical history, family history, social history, surgical history, and allergies today and no changes were needed.  Please see the problem list section below in epic for further details.  Past Medical History: Past Medical History:  Diagnosis Date  . Anxiety   . Low libido   . Low testosterone    Past Surgical History: Past Surgical History:  Procedure Laterality Date  . NO PAST SURGERIES     Social History: Social History   Socioeconomic History  . Marital status: Single    Spouse name: Not on file  . Number of children: Not on file  . Years of education: Not on file  . Highest education level: Not on file  Occupational History  . Not on file  Tobacco Use  . Smoking status: Never Smoker  . Smokeless tobacco: Never Used  Substance and Sexual Activity  . Alcohol use: Yes    Alcohol/week: 5.0 standard drinks    Types: 5 Standard drinks or equivalent per week  . Drug use: Yes    Types: Marijuana    Comment: 2 times per week  . Sexual activity: Yes    Birth control/protection: I.U.D.  Other Topics Concern  . Not on file  Social History Narrative  . Not on file    Social Determinants of Health   Financial Resource Strain:   . Difficulty of Paying Living Expenses: Not on file  Food Insecurity:   . Worried About Charity fundraiser in the Last Year: Not on file  . Ran Out of Food in the Last Year: Not on file  Transportation Needs:   . Lack of Transportation (Medical): Not on file  . Lack of Transportation (Non-Medical): Not on file  Physical Activity:   . Days of Exercise per Week: Not on file  . Minutes of Exercise per Session: Not on file  Stress:   . Feeling of Stress : Not on file  Social Connections:   . Frequency of Communication with Friends and Family: Not on file  . Frequency of Social Gatherings with Friends and Family: Not on file  . Attends Religious Services: Not on file  . Active Member of Clubs or Organizations: Not on file  . Attends Archivist Meetings: Not on file  . Marital Status: Not on file   Family History: Family History  Problem Relation Age of Onset  . Fibromyalgia Mother   . Heart murmur Mother   . High blood pressure Brother    Allergies: No Known Allergies Medications: See med rec.  Review of Systems: No fevers, chills, night sweats, weight loss, chest pain, or shortness of breath.   Objective:    General: Well Developed, well nourished, and  in no acute distress.  Neuro: Alert and oriented x3. HEENT: Normocephalic, atraumatic.  Skin: Warm and dry. Cardiac: Irregular rate and rhythm, S1 and S2 identified, no murmurs rubs or gallops, no lower extremity edema.  Respiratory: Clear to auscultation bilaterally. Not using accessory muscles, speaking in full sentences.    Impression and Recommendations:    Elevated BP   BP normal in office at 124/70 with HR 84  Obtain automatic BP monitor if possible  Check BP daily at different times of the day and keep record for the next 2 weeks  Send record via MyChart for evaluation  Continue to exercise regularly  Maintain low sodium  diet  Checking CBC, CMP, TSH, Lipids   Return if symptoms worsen or fail to improve.  ___________________________________________ Thayer Ohm, DNP, APRN, FNP-BC Primary Care and Sports Medicine [facility]

## 2020-01-02 ENCOUNTER — Ambulatory Visit: Payer: 59 | Admitting: Family Medicine

## 2020-01-03 ENCOUNTER — Ambulatory Visit: Payer: 59 | Admitting: Family Medicine

## 2020-01-05 ENCOUNTER — Ambulatory Visit: Payer: 59 | Admitting: Family Medicine

## 2020-01-29 IMAGING — DX LEFT FOOT - COMPLETE 3+ VIEW
3 series · 3 of 3 positions shown · non-contrast
Comparison: None.

CLINICAL DATA: Foot pain for 1 month, no known injury, initial
encounter

EXAM:
LEFT FOOT - COMPLETE 3+ VIEW

[foot ap]
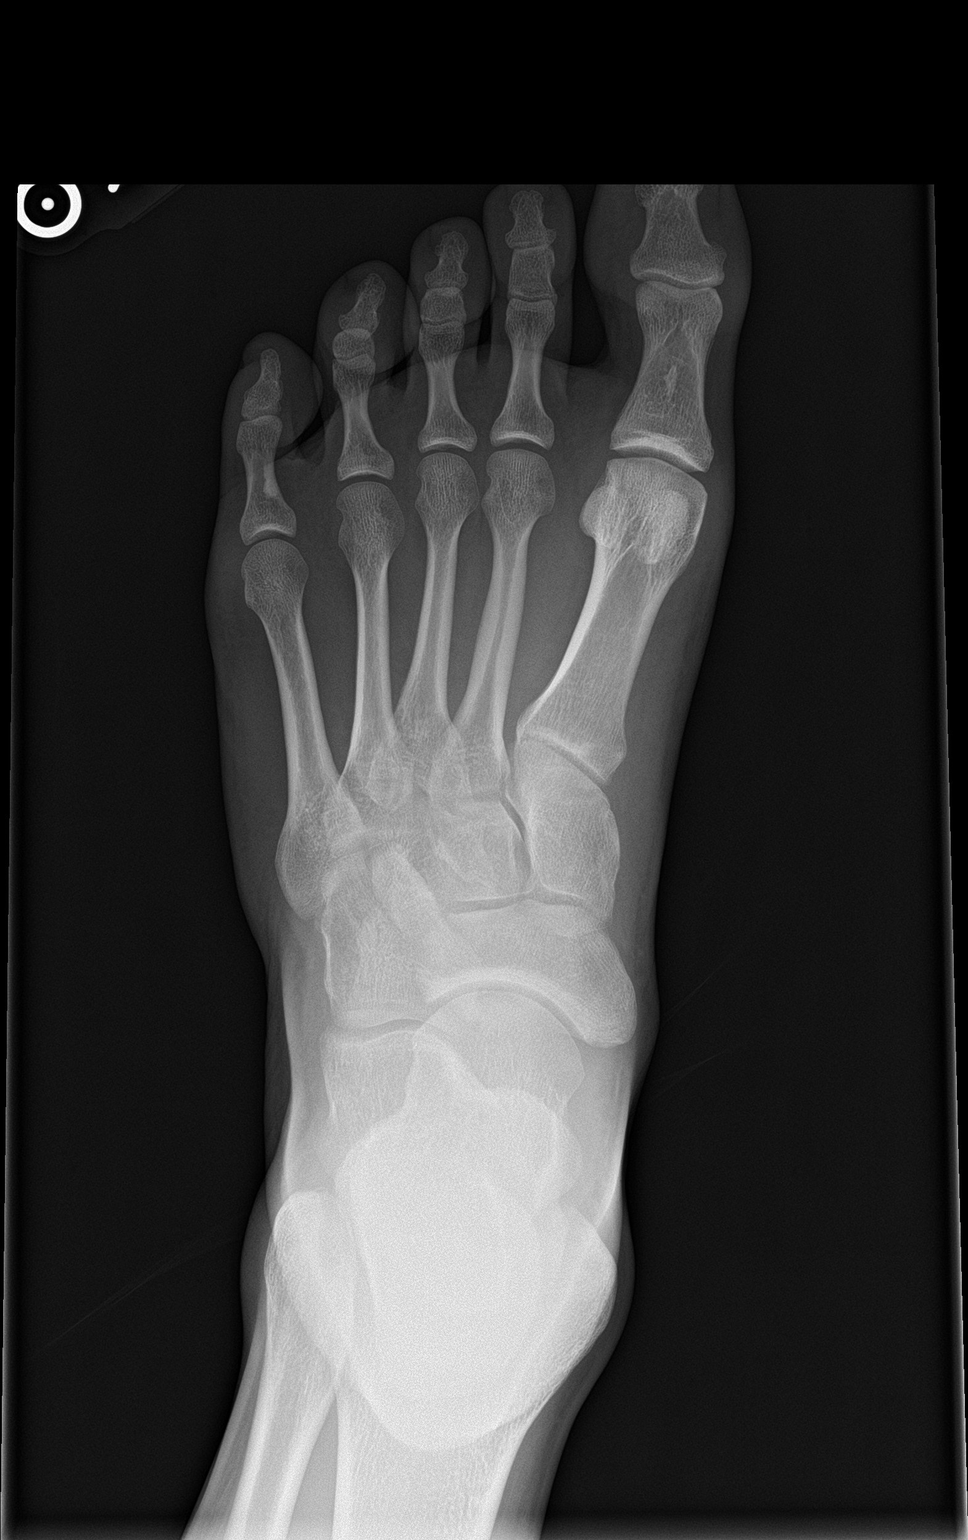

[foot obl]
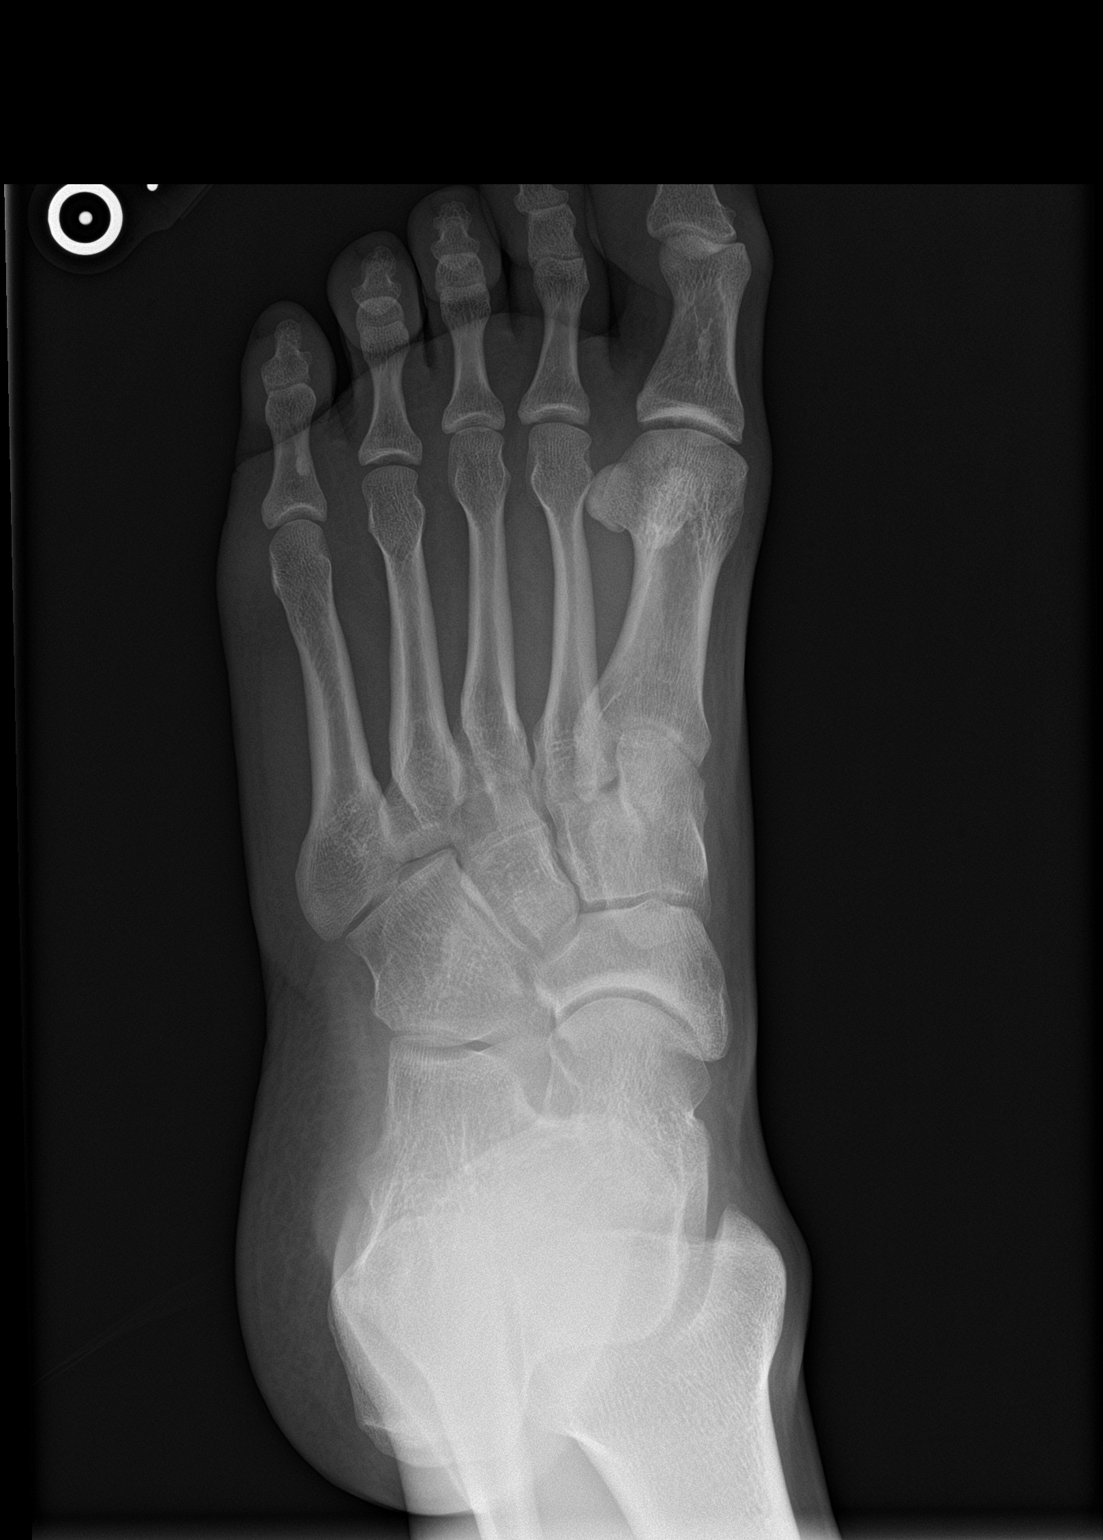

[foot lat]
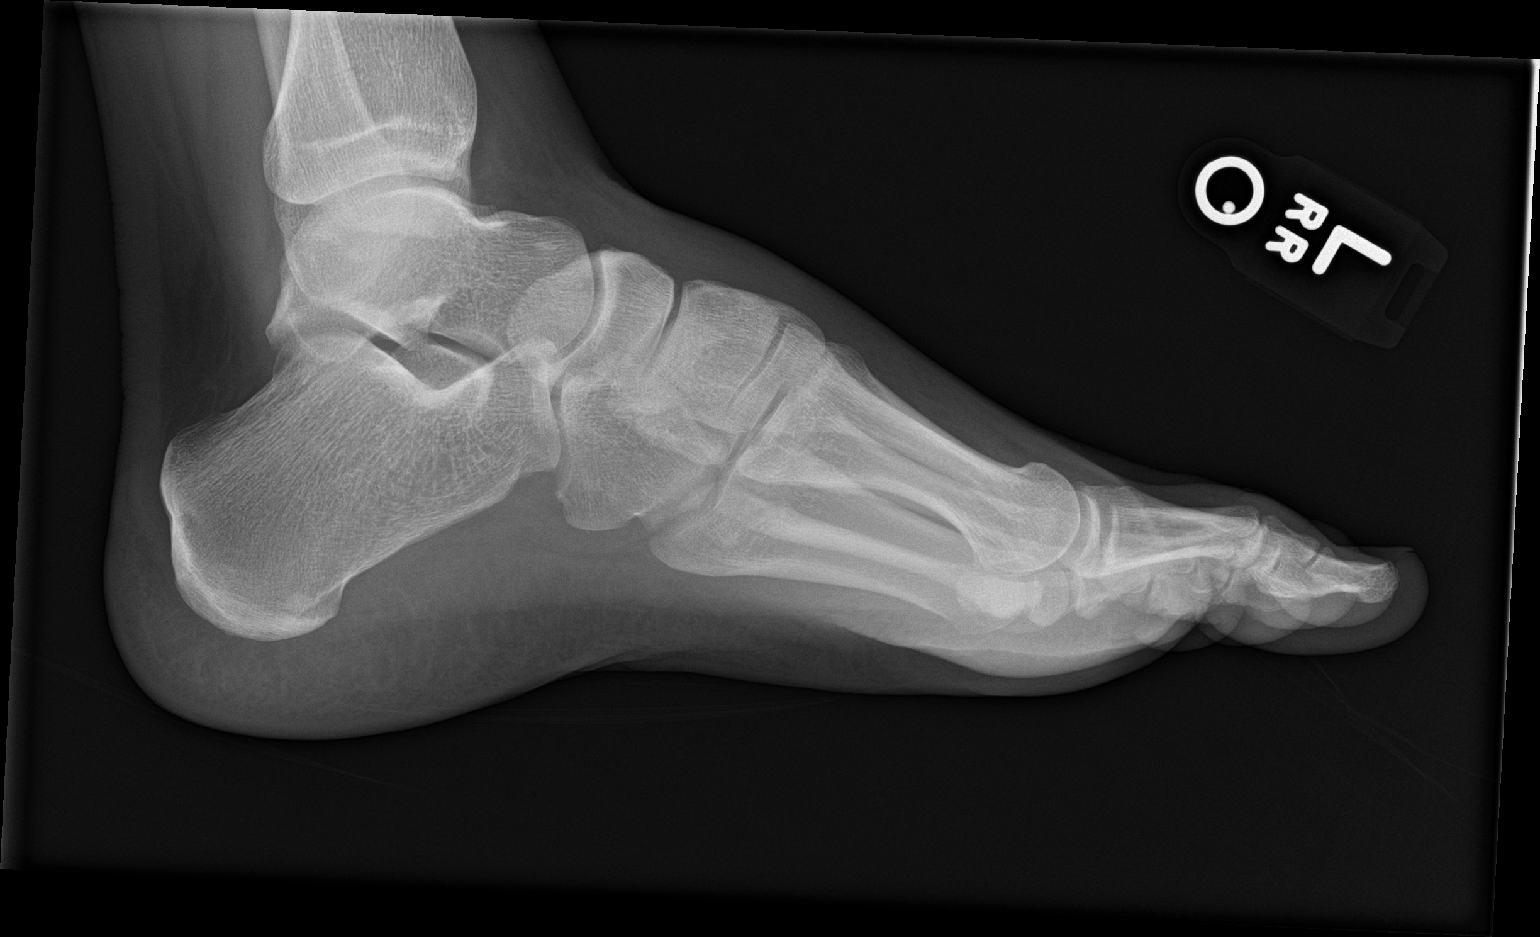

[3 of 3 positions shown; findings below may reference images not displayed]

FINDINGS: There is no evidence of fracture or dislocation. There is no
evidence of arthropathy or other focal bone abnormality. Soft
tissues are unremarkable.
IMPRESSION: No acute abnormality noted.

## 2020-02-21 ENCOUNTER — Encounter: Payer: Self-pay | Admitting: Family Medicine

## 2020-02-21 ENCOUNTER — Other Ambulatory Visit: Payer: Self-pay

## 2020-02-21 ENCOUNTER — Ambulatory Visit (INDEPENDENT_AMBULATORY_CARE_PROVIDER_SITE_OTHER): Payer: 59 | Admitting: Family Medicine

## 2020-02-21 VITALS — BP 130/80 | HR 57 | Temp 97.7°F | Ht 71.0 in | Wt 214.0 lb

## 2020-02-21 DIAGNOSIS — L539 Erythematous condition, unspecified: Secondary | ICD-10-CM

## 2020-02-21 DIAGNOSIS — R030 Elevated blood-pressure reading, without diagnosis of hypertension: Secondary | ICD-10-CM

## 2020-02-21 NOTE — Patient Instructions (Signed)
Great to meet you today! You do not need medication for your BP at this time but keep an eye on your BP at home. I recommend that you quit smoking/vaping to keep BP under good control and reduce risk of future health problems The redness in the feet appears to be dilation of blood vessels that sit close to the surface and could be from increased blood flow after exercise and use of vasodilators such as L-arginine.  I am checking labs to be sure you do not have too many red blood cells and that kidney and liver function are normal.   We'll be in touch with lab results.

## 2020-02-21 NOTE — Assessment & Plan Note (Signed)
Improved today, no signs of cellulitis or edema.  Pictures he shows me on phone consistent with redness related to capillary bed dilation.  This is likely due to increased exercise and use of vasodilator supplements.  Will check CBC to make sure H&H are normal.

## 2020-02-21 NOTE — Assessment & Plan Note (Signed)
BP recheck normal.   May have some white coat hypertension.  He will monitor at home.   Discussed need to quit smoking.  Update labs.

## 2020-02-21 NOTE — Progress Notes (Signed)
Jeffrey Kent - 24 y.o. male MRN [id number]  Date of birth: Sep 02, 1996  Subjective Chief Complaint  Patient presents with  . foot redness    HPI Jeffrey Kent is a 24 y.o. male with history of elevated blood pressure here today with concern of redness and possible swelling in feet.  He states that he noticed redness with some mild itching in feet after working out last night.  He has had this a couple of times before.  When pressing on his feet there is blanching but he denies pitting edema.  He denies any associated symptoms including chest pain, shortness of breath, headache.  He is taking a L-arginine supplement each day.  He denies exogenous testosterone supplementation.  He is a daily smoker.    ROS:  A comprehensive ROS was completed and negative except as noted per HPI  No Known Allergies  Past Medical History:  Diagnosis Date  . Anxiety   . Low libido   . Low testosterone     Past Surgical History:  Procedure Laterality Date  . NO PAST SURGERIES      Social History   Socioeconomic History  . Marital status: Single    Spouse name: Not on file  . Number of children: Not on file  . Years of education: Not on file  . Highest education level: Not on file  Occupational History  . Not on file  Tobacco Use  . Smoking status: Never Smoker  . Smokeless tobacco: Never Used  Substance and Sexual Activity  . Alcohol use: Yes    Alcohol/week: 5.0 standard drinks    Types: 5 Standard drinks or equivalent per week  . Drug use: Yes    Types: Marijuana    Comment: 2 times per week  . Sexual activity: Yes    Birth control/protection: I.U.D.  Other Topics Concern  . Not on file  Social History Narrative  . Not on file   Social Determinants of Health   Financial Resource Strain:   . Difficulty of Paying Living Expenses:   Food Insecurity:   . Worried About Charity fundraiser in the Last Year:   . Arboriculturist in the Last Year:   Transportation Needs:   . Lexicographer (Medical):   Marland Kitchen Lack of Transportation (Non-Medical):   Physical Activity:   . Days of Exercise per Week:   . Minutes of Exercise per Session:   Stress:   . Feeling of Stress :   Social Connections:   . Frequency of Communication with Friends and Family:   . Frequency of Social Gatherings with Friends and Family:   . Attends Religious Services:   . Active Member of Clubs or Organizations:   . Attends Archivist Meetings:   Marland Kitchen Marital Status:     Family History  Problem Relation Age of Onset  . Fibromyalgia Mother   . Heart murmur Mother   . High blood pressure Brother     Health Maintenance  Topic Date Due  . HIV Screening  Never done  . INFLUENZA VACCINE  06/03/2020  . TETANUS/TDAP  01/10/2030     ----------------------------------------------------------------------------------------------------------------------------------------------------------------------------------------------------------------- Physical Exam BP 130/80   Pulse (!) 57   Temp 97.7 F (36.5 C) (Oral)   Ht 5\' 11"  (1.803 m)   Wt 214 lb (97.1 kg)   BMI 29.85 kg/m   Physical Exam Constitutional:      Appearance: Normal appearance.  HENT:     Head: Normocephalic  and atraumatic.  Eyes:     General: No scleral icterus. Cardiovascular:     Rate and Rhythm: Normal rate and regular rhythm.  Pulmonary:     Effort: Pulmonary effort is normal.     Breath sounds: Normal breath sounds.  Musculoskeletal:        General: Normal range of motion.     Cervical back: Neck supple.  Skin:    General: Skin is warm and dry.  Neurological:     General: No focal deficit present.     Mental Status: He is alert.  Psychiatric:        Mood and Affect: Mood normal.        Behavior: Behavior normal.      ------------------------------------------------------------------------------------------------------------------------------------------------------------------------------------------------------------------- Assessment and Plan  Redness of skin Improved today, no signs of cellulitis or edema.  Pictures he shows me on phone consistent with redness related to capillary bed dilation.  This is likely due to increased exercise and use of vasodilator supplements.  Will check CBC to make sure H&H are normal.    Elevated blood pressure reading BP recheck normal.   May have some white coat hypertension.  He will monitor at home.   Discussed need to quit smoking.  Update labs.    No orders of the defined types were placed in this encounter.   No follow-ups on file.    This visit occurred during the SARS-CoV-2 public health emergency.  Safety protocols were in place, including screening questions prior to the visit, additional usage of staff PPE, and extensive cleaning of exam room while observing appropriate contact time as indicated for disinfecting solutions.

## 2020-02-22 LAB — COMPLETE METABOLIC PANEL WITH GFR
AG Ratio: 1.8 (calc) (ref 1.0–2.5)
ALT: 43 U/L (ref 9–46)
AST: 38 U/L (ref 10–40)
Albumin: 4.5 g/dL (ref 3.6–5.1)
Alkaline phosphatase (APISO): 82 U/L (ref 36–130)
BUN: 18 mg/dL (ref 7–25)
CO2: 30 mmol/L (ref 20–32)
Calcium: 9.6 mg/dL (ref 8.6–10.3)
Chloride: 102 mmol/L (ref 98–110)
Creat: 1.02 mg/dL (ref 0.60–1.35)
GFR, Est African American: 120 mL/min/{1.73_m2} (ref >=60)
GFR, Est Non African American: 104 mL/min/{1.73_m2} (ref >=60)
Globulin: 2.5 g/dL (calc) (ref 1.9–3.7)
Glucose, Bld: 70 mg/dL (ref 65–139)
Potassium: 4.5 mmol/L (ref 3.5–5.3)
Sodium: 139 mmol/L (ref 135–146)
Total Bilirubin: 0.5 mg/dL (ref 0.2–1.2)
Total Protein: 7 g/dL (ref 6.1–8.1)

## 2020-02-22 LAB — URINALYSIS
Bilirubin Urine: NEGATIVE
Glucose, UA: NEGATIVE
Hgb urine dipstick: NEGATIVE
Ketones, ur: NEGATIVE
Leukocytes,Ua: NEGATIVE
Nitrite: NEGATIVE
Protein, ur: NEGATIVE
Specific Gravity, Urine: 1.01 (ref 1.001–1.03)
pH: 7.5 (ref 5.0–8.0)

## 2020-02-22 LAB — CBC
HCT: 43.2 % (ref 38.5–50.0)
Hemoglobin: 14.2 g/dL (ref 13.2–17.1)
MCH: 29 pg (ref 27.0–33.0)
MCHC: 32.9 g/dL (ref 32.0–36.0)
MCV: 88.3 fL (ref 80.0–100.0)
MPV: 10 fL (ref 7.5–12.5)
Platelets: 303 10*3/uL (ref 140–400)
RBC: 4.89 10*6/uL (ref 4.20–5.80)
RDW: 11.9 % (ref 11.0–15.0)
WBC: 5.3 10*3/uL (ref 3.8–10.8)

## 2020-02-22 LAB — TSH: TSH: 3.95 mIU/L (ref 0.40–4.50)

## 2020-03-20 ENCOUNTER — Ambulatory Visit (INDEPENDENT_AMBULATORY_CARE_PROVIDER_SITE_OTHER): Payer: 59 | Admitting: Family Medicine

## 2020-03-20 ENCOUNTER — Other Ambulatory Visit: Payer: Self-pay

## 2020-03-20 ENCOUNTER — Encounter: Payer: Self-pay | Admitting: Family Medicine

## 2020-03-20 VITALS — BP 153/55 | HR 60 | Ht 70.87 in | Wt 211.4 lb

## 2020-03-20 DIAGNOSIS — R011 Cardiac murmur, unspecified: Secondary | ICD-10-CM | POA: Diagnosis not present

## 2020-03-20 DIAGNOSIS — R0789 Other chest pain: Secondary | ICD-10-CM

## 2020-03-20 NOTE — Patient Instructions (Signed)
Great to see you! I don't think the pain across your chest and murmur are related.  I have ordered an echocardiogram (heart ultrasound) for further evaluation of the heart murmur.  If you decide you want to try medication for anxiety let me know.

## 2020-03-21 DIAGNOSIS — R0789 Other chest pain: Secondary | ICD-10-CM | POA: Insufficient documentation

## 2020-03-21 NOTE — Progress Notes (Signed)
Jeffrey Kent - 24 y.o. male MRN [id number]  Date of birth: 05-02-1996  Subjective Chief Complaint  Patient presents with  . Chest Pain    HPI Jeffrey Kent is a 24 y.o. male here today with complaint of chest discomfort.  Reports discomfort across top of chest and into bilateral axilla.  Started a few weeks ago  Has some tenderness along upper chest.  This improves with exercise.  Doesn't recall injury but does work out often.  He has no shortness of breath,  palpitations, or dizziness.    ROS:  A comprehensive ROS was completed and negative except as noted per HPI  No Known Allergies  Past Medical History:  Diagnosis Date  . Anxiety   . Low libido   . Low testosterone     Past Surgical History:  Procedure Laterality Date  . NO PAST SURGERIES      Social History   Socioeconomic History  . Marital status: Single    Spouse name: Not on file  . Number of children: Not on file  . Years of education: Not on file  . Highest education level: Not on file  Occupational History  . Not on file  Tobacco Use  . Smoking status: Never Smoker  . Smokeless tobacco: Never Used  Substance and Sexual Activity  . Alcohol use: Yes    Alcohol/week: 5.0 standard drinks    Types: 5 Standard drinks or equivalent per week  . Drug use: Yes    Types: Marijuana    Comment: 2 times per week  . Sexual activity: Yes    Birth control/protection: I.U.D.  Other Topics Concern  . Not on file  Social History Narrative  . Not on file   Social Determinants of Health   Financial Resource Strain:   . Difficulty of Paying Living Expenses:   Food Insecurity:   . Worried About Charity fundraiser in the Last Year:   . Arboriculturist in the Last Year:   Transportation Needs:   . Film/video editor (Medical):   Marland Kitchen Lack of Transportation (Non-Medical):   Physical Activity:   . Days of Exercise per Week:   . Minutes of Exercise per Session:   Stress:   . Feeling of Stress :   Social Connections:    . Frequency of Communication with Friends and Family:   . Frequency of Social Gatherings with Friends and Family:   . Attends Religious Services:   . Active Member of Clubs or Organizations:   . Attends Archivist Meetings:   Marland Kitchen Marital Status:     Family History  Problem Relation Age of Onset  . Fibromyalgia Mother   . Heart murmur Mother   . High blood pressure Brother     Health Maintenance  Topic Date Due  . HIV Screening  Never done  . INFLUENZA VACCINE  06/03/2020  . TETANUS/TDAP  01/10/2030     ----------------------------------------------------------------------------------------------------------------------------------------------------------------------------------------------------------------- Physical Exam BP (!) 153/55 (BP Location: Left Arm, Patient Position: Sitting, Cuff Size: Large)   Pulse 60   Ht 5' 10.87" (1.8 m)   Wt 211 lb 6.4 oz (95.9 kg)   SpO2 100%   BMI 29.60 kg/m   Physical Exam Constitutional:      Appearance: Normal appearance. He is well-developed.  Cardiovascular:     Rate and Rhythm: Normal rate and regular rhythm.     Heart sounds: Murmur (6-1/4 systolic murmur.  No change with valsalva) present.  Pulmonary:  Effort: Pulmonary effort is normal.     Breath sounds: Normal breath sounds.  Chest:     Chest wall: Tenderness (ttp along upper pectorals bilaterally. ) present.  Musculoskeletal:     Cervical back: Neck supple.  Skin:    General: Skin is warm and dry.  Neurological:     General: No focal deficit present.     Mental Status: He is alert.  Psychiatric:        Mood and Affect: Mood normal.        Behavior: Behavior normal.     ------------------------------------------------------------------------------------------------------------------------------------------------------------------------------------------------------------------- Assessment and Plan  Chest discomfort TTP across top of chest likely  related to muscle strain.  I think this will probably resolve on its own with activity/exercise modification.  He does have a murmur that is probably unrelated but I have ordered an echo for further evaluation of this.     No orders of the defined types were placed in this encounter.   No follow-ups on file.    This visit occurred during the SARS-CoV-2 public health emergency.  Safety protocols were in place, including screening questions prior to the visit, additional usage of staff PPE, and extensive cleaning of exam room while observing appropriate contact time as indicated for disinfecting solutions.

## 2020-03-21 NOTE — Assessment & Plan Note (Signed)
TTP across top of chest likely related to muscle strain.  I think this will probably resolve on its own with activity/exercise modification.  He does have a murmur that is probably unrelated but I have ordered an echo for further evaluation of this.

## 2020-03-30 ENCOUNTER — Other Ambulatory Visit (HOSPITAL_BASED_OUTPATIENT_CLINIC_OR_DEPARTMENT_OTHER): Payer: 59

## 2020-04-04 ENCOUNTER — Other Ambulatory Visit (HOSPITAL_BASED_OUTPATIENT_CLINIC_OR_DEPARTMENT_OTHER): Payer: 59

## 2020-04-06 ENCOUNTER — Ambulatory Visit (HOSPITAL_BASED_OUTPATIENT_CLINIC_OR_DEPARTMENT_OTHER)
Admission: RE | Admit: 2020-04-06 | Discharge: 2020-04-06 | Disposition: A | Discharge status: Home / Self Care | Payer: 59 | Source: Ambulatory Visit | Attending: Family Medicine | Admitting: Family Medicine

## 2020-04-06 ENCOUNTER — Other Ambulatory Visit: Payer: Self-pay

## 2020-04-06 DIAGNOSIS — R011 Cardiac murmur, unspecified: Secondary | ICD-10-CM | POA: Diagnosis not present

## 2020-04-16 ENCOUNTER — Encounter: Payer: Self-pay | Admitting: Family Medicine

## 2020-04-24 MED ORDER — AMLODIPINE BESYLATE 2.5 MG PO TABS
2.5000 mg | ORAL_TABLET | Freq: Every day | ORAL | 0 refills | Status: DC
Start: 2020-04-24 — End: 2020-06-08

## 2020-04-24 NOTE — Telephone Encounter (Signed)
Sent amlodipine 2.5mg  daily to pharmacy.

## 2020-04-24 NOTE — Telephone Encounter (Signed)
Patient called, Amlodipine was not sent to pharmacy.   Can you please send?

## 2020-05-25 ENCOUNTER — Ambulatory Visit: Payer: 59 | Admitting: Sports Medicine

## 2020-05-28 ENCOUNTER — Encounter: Payer: Self-pay | Admitting: Sports Medicine

## 2020-05-28 ENCOUNTER — Ambulatory Visit (INDEPENDENT_AMBULATORY_CARE_PROVIDER_SITE_OTHER): Payer: 59 | Admitting: Sports Medicine

## 2020-05-28 ENCOUNTER — Other Ambulatory Visit: Payer: Self-pay

## 2020-05-28 DIAGNOSIS — G5611 Other lesions of median nerve, right upper limb: Secondary | ICD-10-CM | POA: Diagnosis not present

## 2020-05-28 MED ORDER — MELOXICAM 15 MG PO TABS: ORAL_TABLET | ORAL | 3 refills | Status: DC

## 2020-05-28 NOTE — Assessment & Plan Note (Signed)
This is a pleasant 24 year old male, for the past few weeks he has had pain he localizes on his anteromedial right elbow, this occurred after lifting some weights, pain was medial, with occasional paresthesias into the hand. On exam he has tenderness at the flexor pronator mass, reproduction of pain with resisted pronation, paresthesias are improving. 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 Return as needed.

## 2020-05-28 NOTE — Progress Notes (Signed)
    Procedures performed today:    None.  Independent interpretation of notes and tests performed by another provider:   None.  Brief History, Exam, Impression, and Recommendations:    Jeffrey Kent is a pleasant 23yo male who presents with complaints of pain in his right elbow. The pain began about 2-3 weeks ago when he overextended on a biceps curl. He rested the bicep for a week without any improvement and then continued normal exercise. Meloxicam has helped to aleve the pain slightly. On exam he has tenderness at the medial epicondyle with pain upon pronation. 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 We are going to prescribe meloxicam to improve inflammation, advise to wear an elbow sleeve and reduce exercising with that arm apart from rehabilitation exercises. He can follow up in 4 weeks as needed if the pain persists.   Aurelio Jew, MS3   ___________________________________________ Ihor Austin. Benjamin Stain, M.D., ABFM., CAQSM. Primary Care and Sports Medicine [facility]  Adjunct Instructor of Family Medicine  [employer]

## 2020-06-07 ENCOUNTER — Encounter: Payer: Self-pay | Admitting: Family Medicine

## 2020-06-08 ENCOUNTER — Other Ambulatory Visit: Payer: Self-pay

## 2020-06-08 MED ORDER — AMLODIPINE BESYLATE 2.5 MG PO TABS: 2.5000 mg | ORAL_TABLET | Freq: Every day | ORAL | 0 refills | Status: DC

## 2020-06-10 IMAGING — CT CT ABD-PELV W/ CM
2 of 4 series · 16 of 46 positions shown, 18 images · IV contrast (APPLIED)
Comparison: None.

CLINICAL DATA: Left groin pain for 1 week with onset of left side
abdominal pain 2 days ago.

EXAM:
CT ABDOMEN AND PELVIS WITH CONTRAST
TECHNIQUE: Multidetector CT imaging of the abdomen and pelvis was performed
using the standard protocol following bolus administration of
intravenous contrast.
CONTRAST:  100 mL OMNIPAQUE IOHEXOL 300 MG/ML  SOLN

[Series 2: axial st · axial · 0.89mm/px · z∈[-597,-92]mm · 13 of 111 slices shown, 15 images]
[im 5/111  soft-tissue]
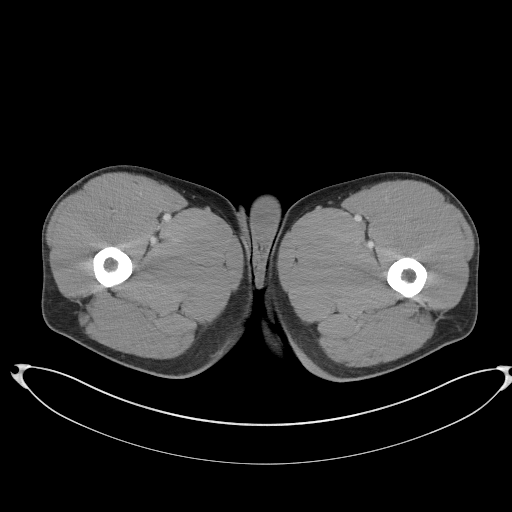
[im 5/111  bone]
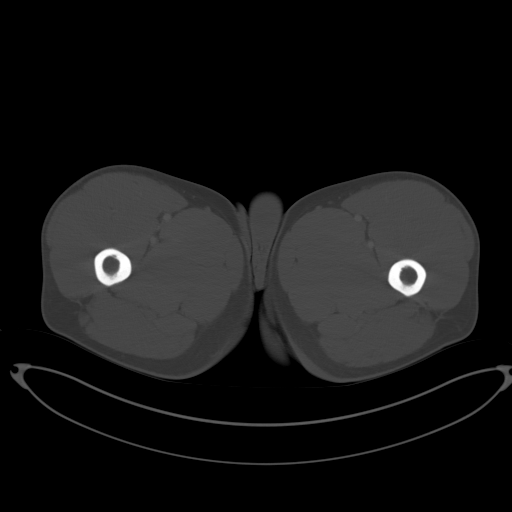
[im 14/111  soft-tissue]
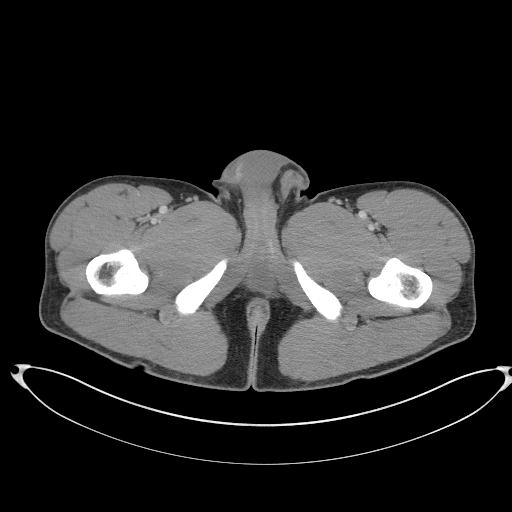
[im 23/111  soft-tissue]
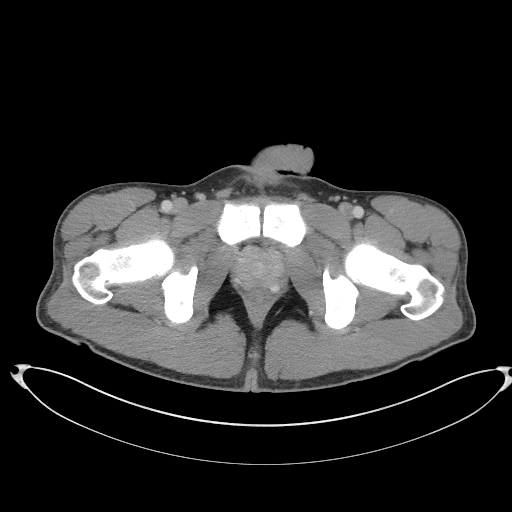
[im 31/111  soft-tissue]
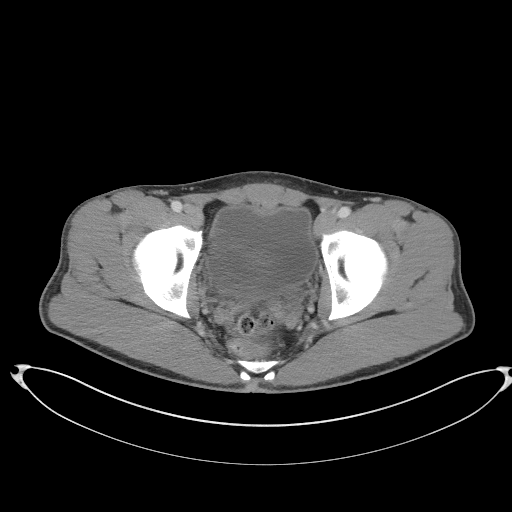
[im 40/111  soft-tissue]
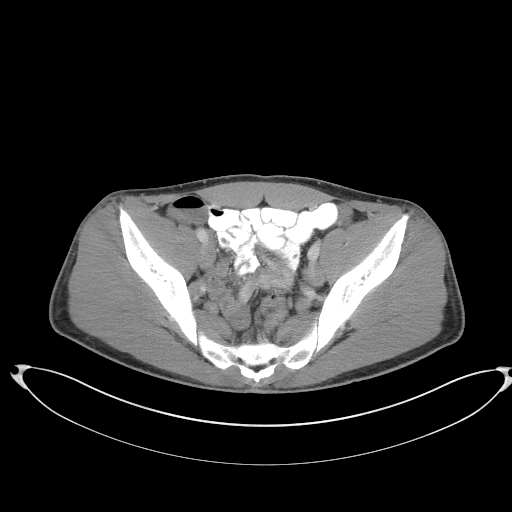
[im 49/111  soft-tissue]
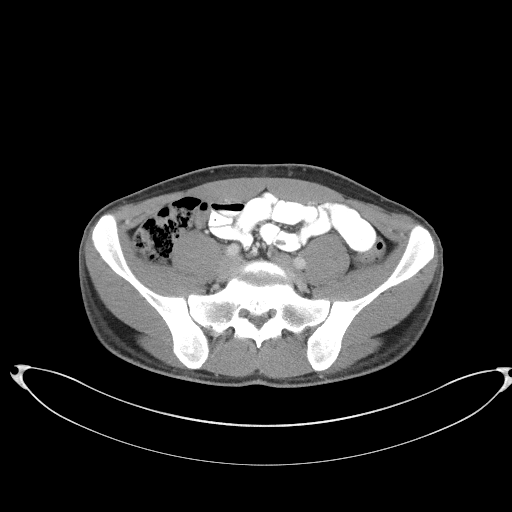
[im 58/111  soft-tissue]
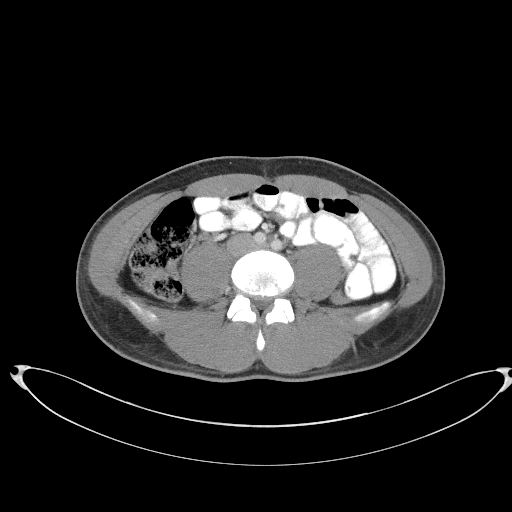
[im 62/111  soft-tissue]
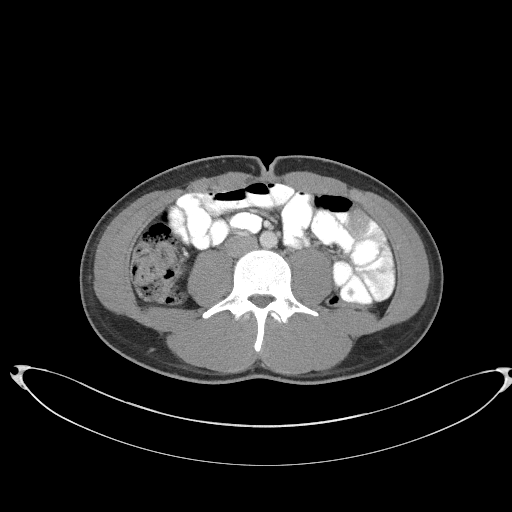
[im 71/111  soft-tissue]
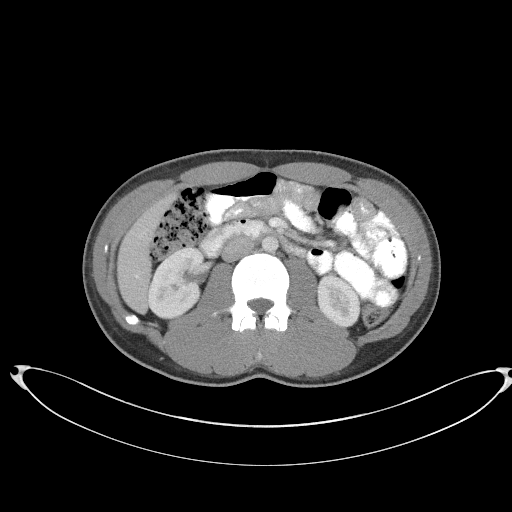
[im 71/111  bone]
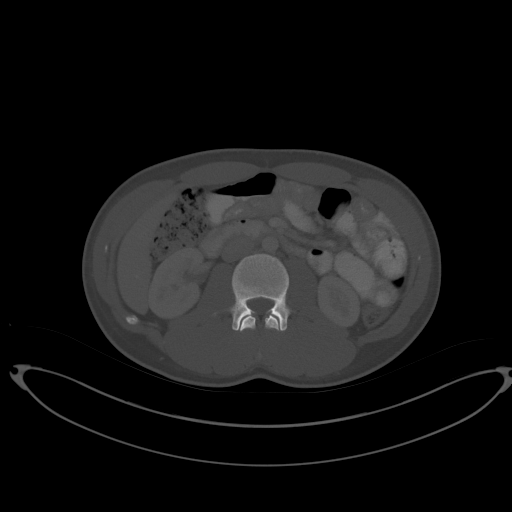
[im 80/111  soft-tissue]
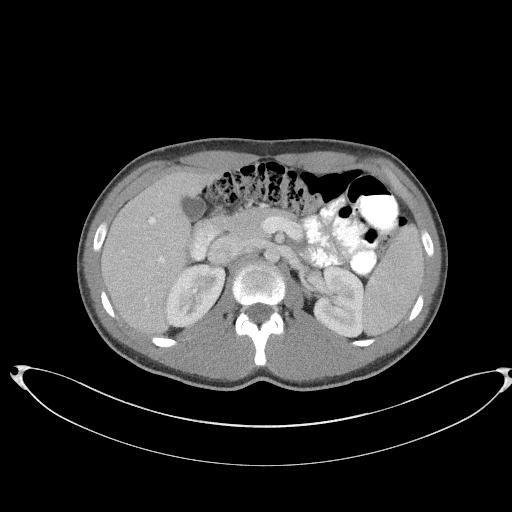
[im 89/111  soft-tissue]
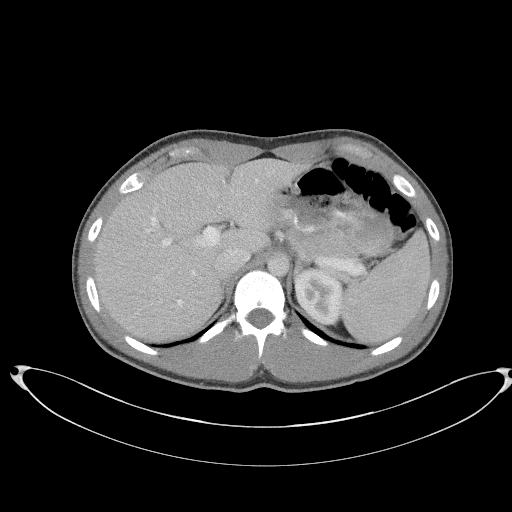
[im 97/111  soft-tissue]
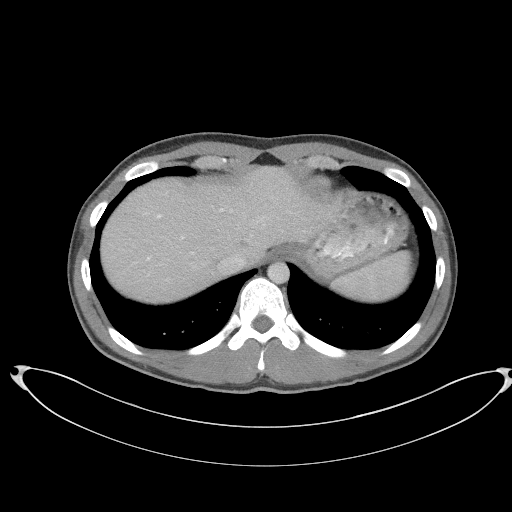
[im 106/111  soft-tissue]
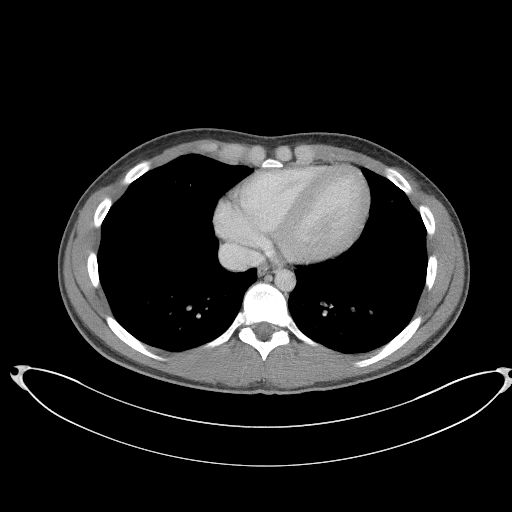

[Series 5: coronal st · coronal · 0.82mm/px · 3 of 82 slices shown]
[im 28/82  soft-tissue]
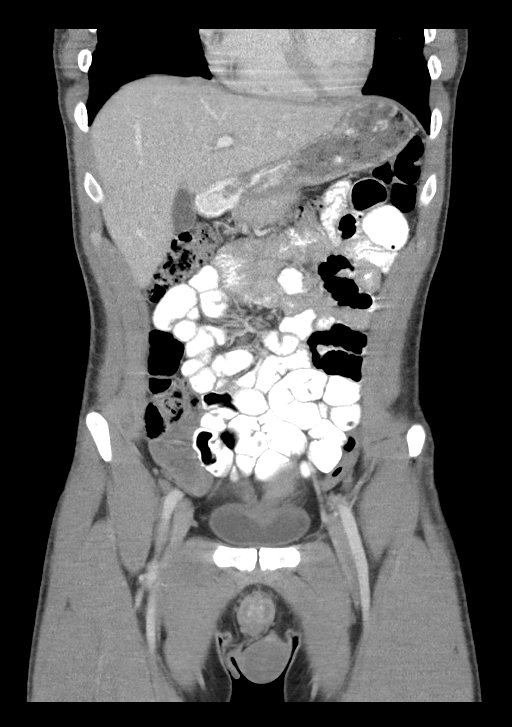
[im 37/82  soft-tissue]
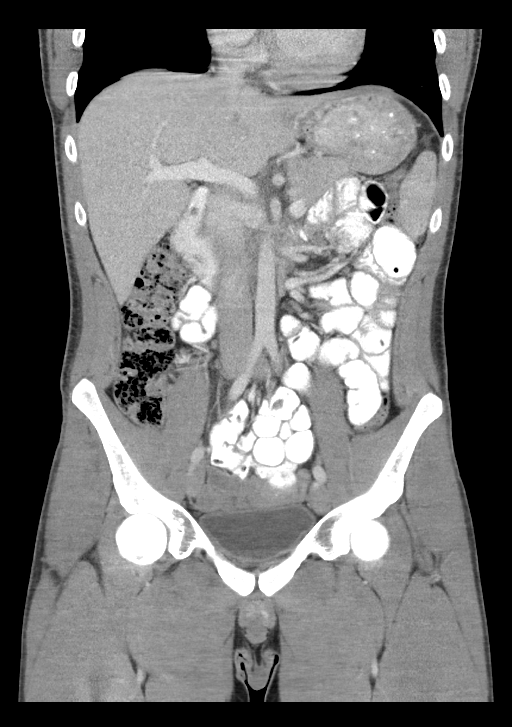
[im 46/82  soft-tissue]
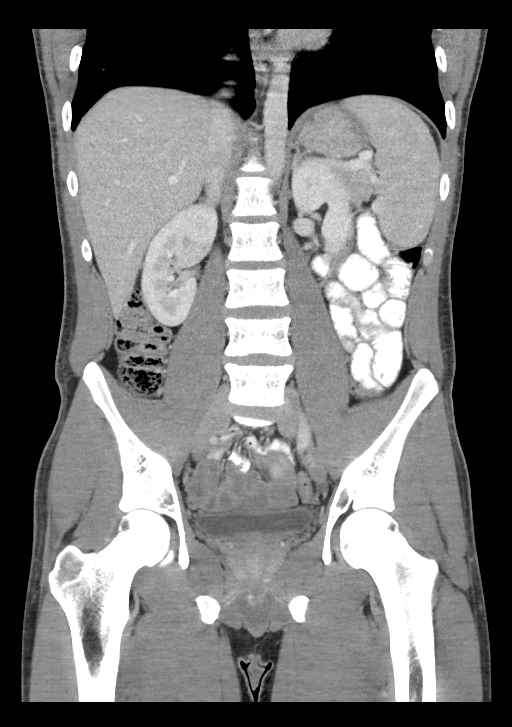

[16 of 46 positions shown; findings below may reference images not displayed]

FINDINGS: Lower chest: Lung bases clear.  No pleural or pericardial effusion.

Hepatobiliary: No focal liver abnormality is seen. No gallstones,
gallbladder wall thickening, or biliary dilatation.

Pancreas: Unremarkable. No pancreatic ductal dilatation or
surrounding inflammatory changes.

Spleen: Normal in size without focal abnormality.

Adrenals/Urinary Tract: Adrenal glands are unremarkable. Kidneys are
normal, without renal calculi, focal lesion, or hydronephrosis.
Bladder is unremarkable.

Stomach/Bowel: Stomach is within normal limits. The appendix is not
seen but no inflammatory change is present. No evidence of bowel
wall thickening, distention, or inflammatory changes.

Vascular/Lymphatic: No significant vascular findings are present. No
enlarged abdominal or pelvic lymph nodes.

Reproductive: Prostate is unremarkable.

Other: None.

Musculoskeletal: Normal.
IMPRESSION: Normal CT abdomen and pelvis. No finding to explain the patient's
symptoms.

## 2020-07-20 ENCOUNTER — Other Ambulatory Visit: Payer: Self-pay | Admitting: Family Medicine

## 2020-07-20 NOTE — Telephone Encounter (Signed)
Per note, patient needs an appt with Dr. Ashley Royalty to access BP since starting medication.  Please call and schedule.

## 2020-07-24 NOTE — Telephone Encounter (Signed)
PT scheduled

## 2020-07-26 ENCOUNTER — Other Ambulatory Visit: Payer: Self-pay

## 2020-07-26 ENCOUNTER — Ambulatory Visit (INDEPENDENT_AMBULATORY_CARE_PROVIDER_SITE_OTHER): Payer: BC Managed Care – PPO | Admitting: Family Medicine

## 2020-07-26 ENCOUNTER — Encounter: Payer: Self-pay | Admitting: Family Medicine

## 2020-07-26 DIAGNOSIS — I10 Essential (primary) hypertension: Secondary | ICD-10-CM | POA: Insufficient documentation

## 2020-07-26 MED ORDER — AMLODIPINE BESYLATE 5 MG PO TABS: 5.0000 mg | ORAL_TABLET | Freq: Every day | ORAL | 1 refills | Status: AC

## 2020-07-26 NOTE — Assessment & Plan Note (Signed)
Blood pressure is not at goal at for age and co-morbidities.  I recommend increase of amlodipine to 5mg .  In addition they were instructed to follow a low sodium diet with regular exercise to help to maintain adequate control of blood pressure.

## 2020-07-26 NOTE — Progress Notes (Signed)
Jeffrey Kent - 24 y.o. male MRN [id number]  Date of birth: 07-02-1996  Subjective Chief Complaint  Patient presents with  . Hypertension    HPI Jeffrey Kent is a 24 y.o. male here today for follow up of HTN.  He reports that he is doing well with current dose of amlodipine.  He does not monitor BP at home.  He denies side effects from amlodipine at current dose.  He has not had symptoms related to HTN including chest pain, shortness of breath, palpitations, headache or vision changes.   ROS:  A comprehensive ROS was completed and negative except as noted per HPI  No Known Allergies  Past Medical History:  Diagnosis Date  . Anxiety   . Low libido   . Low testosterone     Past Surgical History:  Procedure Laterality Date  . NO PAST SURGERIES      Social History   Socioeconomic History  . Marital status: Single    Spouse name: Not on file  . Number of children: Not on file  . Years of education: Not on file  . Highest education level: Not on file  Occupational History  . Not on file  Tobacco Use  . Smoking status: Never Smoker  . Smokeless tobacco: Never Used  Vaping Use  . Vaping Use: Never used  Substance and Sexual Activity  . Alcohol use: Yes    Alcohol/week: 5.0 standard drinks    Types: 5 Standard drinks or equivalent per week  . Drug use: Yes    Types: Marijuana    Comment: 2 times per week  . Sexual activity: Yes    Birth control/protection: I.U.D.  Other Topics Concern  . Not on file  Social History Narrative  . Not on file   Social Determinants of Health   Financial Resource Strain:   . Difficulty of Paying Living Expenses: Not on file  Food Insecurity:   . Worried About Programme researcher, broadcasting/film/video in the Last Year: Not on file  . Ran Out of Food in the Last Year: Not on file  Transportation Needs:   . Lack of Transportation (Medical): Not on file  . Lack of Transportation (Non-Medical): Not on file  Physical Activity:   . Days of Exercise per Week: Not  on file  . Minutes of Exercise per Session: Not on file  Stress:   . Feeling of Stress : Not on file  Social Connections:   . Frequency of Communication with Friends and Family: Not on file  . Frequency of Social Gatherings with Friends and Family: Not on file  . Attends Religious Services: Not on file  . Active Member of Clubs or Organizations: Not on file  . Attends Banker Meetings: Not on file  . Marital Status: Not on file    Family History  Problem Relation Age of Onset  . Fibromyalgia Mother   . Heart murmur Mother   . High blood pressure Brother     Health Maintenance  Topic Date Due  . Hepatitis C Screening  Never done  . COVID-19 Vaccine (1) Never done  . HIV Screening  Never done  . INFLUENZA VACCINE  06/03/2020  . TETANUS/TDAP  01/10/2030     ----------------------------------------------------------------------------------------------------------------------------------------------------------------------------------------------------------------- Physical Exam BP (!) 150/72 (BP Location: Right Arm, Patient Position: Sitting, Cuff Size: Large)   Pulse (!) 58   Wt 210 lb 6.4 oz (95.4 kg)   SpO2 100%   BMI 29.46 kg/m   Physical Exam  Constitutional:      Appearance: Normal appearance.  HENT:     Head: Normocephalic and atraumatic.  Eyes:     General: No scleral icterus. Cardiovascular:     Rate and Rhythm: Normal rate and regular rhythm.  Pulmonary:     Effort: Pulmonary effort is normal.     Breath sounds: Normal breath sounds.  Musculoskeletal:     Cervical back: Neck supple.  Neurological:     General: No focal deficit present.     Mental Status: He is alert.  Psychiatric:        Mood and Affect: Mood normal.        Behavior: Behavior normal.      ------------------------------------------------------------------------------------------------------------------------------------------------------------------------------------------------------------------- Assessment and Plan  Essential hypertension Blood pressure is not at goal at for age and co-morbidities.  I recommend increase of amlodipine to 5mg .  In addition they were instructed to follow a low sodium diet with regular exercise to help to maintain adequate control of blood pressure.     Meds ordered this encounter  Medications  . amLODipine (NORVASC) 5 MG tablet    Sig: Take 1 tablet (5 mg total) by mouth daily.    Dispense:  90 tablet    Refill:  1    Return in about 3 months (around 10/25/2020) for HTN.    This visit occurred during the SARS-CoV-2 public health emergency.  Safety protocols were in place, including screening questions prior to the visit, additional usage of staff PPE, and extensive cleaning of exam room while observing appropriate contact time as indicated for disinfecting solutions.

## 2020-07-27 ENCOUNTER — Ambulatory Visit: Payer: 59 | Admitting: Family Medicine

## 2021-03-07 ENCOUNTER — Telehealth: Payer: Self-pay | Admitting: General Practice

## 2021-03-07 NOTE — Telephone Encounter (Signed)
Transition Care Management Unsuccessful Follow-up Telephone Call  Date of discharge and from where:  [facility] 03/06/21  Attempts:  1st Attempt  Reason for unsuccessful TCM follow-up call:  Voice mail full

## 2021-03-08 NOTE — Telephone Encounter (Signed)
Transition Care Management Follow-up Telephone Call  Date of discharge and from where: 03/06/21 [facility]  How have you been since you were released from the hospital? Doing ok.   Any questions or concerns? No  Items Reviewed:  Did the pt receive and understand the discharge instructions provided? Yes   Medications obtained and verified? Yes   Other? No   Any new allergies since your discharge? No   Dietary orders reviewed? Yes  Do you have support at home? Yes   Home Care and Equipment/Supplies: Were home health services ordered? no  Functional Questionnaire: (I = Independent and D = Dependent) ADLs: I  Bathing/Dressing- I  Meal Prep- I  Eating- I  Maintaining continence- I  Transferring/Ambulation- I  Managing Meds- I    Follow up appointments reviewed:   PCP Hospital f/u appt confirmed? No  Patient didn't want to schedule an appointment at this time since he is feeling a little better. He will call back to schedule if needed.  Specialist Hospital f/u appt confirmed? No    Are transportation arrangements needed? No   If their condition worsens, is the pt aware to call PCP or go to the Emergency Dept.? Yes  Was the patient provided with contact information for the PCP's office or ED? Yes  Was to pt encouraged to call back with questions or concerns? Yes
